# Patient Record
Sex: Male | Born: 1990
Health system: Southern US, Community
[De-identification: ages and names within clinical notes are randomized; demographics above are authoritative.]

## PROBLEM LIST (undated history)

## (undated) DIAGNOSIS — Q676 Pectus excavatum: Secondary | ICD-10-CM

## (undated) DIAGNOSIS — L709 Acne, unspecified: Secondary | ICD-10-CM

## (undated) HISTORY — DX: Acne, unspecified: L70.9

## (undated) HISTORY — DX: Pectus excavatum: Q67.6

---
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
## 2013-04-25 ENCOUNTER — Ambulatory Visit
Admission: RE | Admit: 2013-04-25 | Discharge: 2013-04-25 | Disposition: A | Discharge status: Home / Self Care | Payer: No Typology Code available for payment source | Source: Ambulatory Visit | Attending: Occupational Medicine | Admitting: Occupational Medicine

## 2013-04-25 ENCOUNTER — Other Ambulatory Visit: Payer: Self-pay | Admitting: Occupational Medicine

## 2013-04-25 DIAGNOSIS — Z021 Encounter for pre-employment examination: Secondary | ICD-10-CM

## 2015-02-22 ENCOUNTER — Ambulatory Visit (INDEPENDENT_AMBULATORY_CARE_PROVIDER_SITE_OTHER): Payer: 59 | Admitting: Family Medicine

## 2015-02-22 VITALS — BP 108/67 | HR 53 | Temp 97.9°F | Resp 18 | Ht 75.0 in | Wt 194.5 lb

## 2015-02-22 DIAGNOSIS — T675XXA Heat exhaustion, unspecified, initial encounter: Secondary | ICD-10-CM | POA: Diagnosis not present

## 2015-02-22 DIAGNOSIS — R112 Nausea with vomiting, unspecified: Secondary | ICD-10-CM

## 2015-02-22 DIAGNOSIS — R197 Diarrhea, unspecified: Secondary | ICD-10-CM | POA: Diagnosis not present

## 2015-02-22 LAB — POCT CBC
GRANULOCYTE PERCENT: 54.8 % (ref 37–80)
HCT, POC: 49.9 % (ref 43.5–53.7)
Hemoglobin: 15.5 g/dL (ref 14.1–18.1)
Lymph, poc: 1.2 (ref 0.6–3.4)
MCH: 27.4 pg (ref 27–31.2)
MCHC: 31 g/dL — AB (ref 31.8–35.4)
MCV: 88.3 fL (ref 80–97)
MID (cbc): 0.6 (ref 0–0.9)
MPV: 7.3 fL (ref 0–99.8)
PLATELET COUNT, POC: 201 10*3/uL (ref 142–424)
POC Granulocyte: 2.1 (ref 2–6.9)
POC LYMPH PERCENT: 30.4 %L (ref 10–50)
POC MID %: 14.8 %M — AB (ref 0–12)
RBC: 5.65 M/uL (ref 4.69–6.13)
RDW, POC: 13 %
WBC: 3.8 10*3/uL — AB (ref 4.6–10.2)

## 2015-02-22 LAB — POCT URINALYSIS DIPSTICK
Bilirubin, UA: NEGATIVE
Blood, UA: NEGATIVE
Glucose, UA: NEGATIVE
Ketones, UA: NEGATIVE
Leukocytes, UA: NEGATIVE
Nitrite, UA: NEGATIVE
PH UA: 6
PROTEIN UA: NEGATIVE
Urobilinogen, UA: 0.2

## 2015-02-22 LAB — GLUCOSE, POCT (MANUAL RESULT ENTRY): POC GLUCOSE: 93 mg/dL (ref 70–99)

## 2015-02-22 MED ORDER — HYOSCYAMINE SULFATE 0.125 MG PO TABS
0.1250 mg | ORAL_TABLET | Freq: Four times a day (QID) | ORAL | Status: DC | PRN
Start: 2015-02-22 — End: 2016-01-20

## 2015-02-22 NOTE — Progress Notes (Signed)
Subjective:    Patient ID: Steven Shields, male    DOB: 07-06-1990, 24 y.o.   MRN: [id number]  02/22/2015  Abdominal Cramping; Nausea; Headache; Dehydration; and Anorexia   HPI This 24 y.o. male presents for evaluation of dehydration.  Onset five days ago; decreased appetite, nausea, headache for the past five days. Headache is slightly improved today but still there.  No fever/chills/sweats.  Excessive fatigue.  Layed around all day yesterday.  No sore throat , ear pain, rhinorrhea, cough.  +vomited dry heaved this morning; did vomit today at 2:00pm.  +diarrhea this morning. Non bloody.  No recent travel.  No sick contacts.  +abdominal pain cramping all week.  No rash.  No tick bites.  Works at [employer]; normal night; had a service test around 4:00pm; outside for two hours with excessive sweating.  Not sure if got too hot.  Nothing new for patient.  Drinking water and powerade during the day. Not eating much at all; usually eats all day but now not hungry at all.  Yesterday, did not eat anything until 4:00pm; ate bacon egg and cheese sandwich and cereal yesterday.  Urinating three times today; yesterday urinated twice.  Tried to increase water yesterday.  Urine is not discolored.  Urine is bright yellow.  No chest pain, palpitations, SOB.  Sluggish.  Has not worked much this week; was off two days ago.  Scheduled to work tomorrow but off tomorrow for a class.     Review of Systems  Constitutional: Positive for activity change, appetite change and fatigue. Negative for fever, chills and diaphoresis.  HENT: Negative for congestion, ear pain, postnasal drip, rhinorrhea, sinus pressure and sore throat.   Eyes: Negative for photophobia and visual disturbance.  Respiratory: Negative for cough and shortness of breath.   Cardiovascular: Negative for chest pain, palpitations and leg swelling.  Gastrointestinal: Positive for nausea, vomiting, abdominal pain and diarrhea. Negative for  constipation, blood in stool, abdominal distention, anal bleeding and rectal pain.  Endocrine: Negative for cold intolerance, heat intolerance, polydipsia, polyphagia and polyuria.  Genitourinary: Negative for decreased urine volume and difficulty urinating.  Skin: Negative for rash.  Neurological: Positive for headaches. Negative for dizziness, tremors, seizures, syncope, facial asymmetry, speech difficulty, weakness, light-headedness and numbness.  Psychiatric/Behavioral: Negative for confusion.    Past Medical History  Diagnosis Date  . Acne   . Pectus excavatum    History reviewed. No pertinent past surgical history. No Known Allergies No current outpatient prescriptions on file.   No current facility-administered medications for this visit.   Social History   Social History  . Marital Status: Single    Spouse Name: N/A  . Number of Children: N/A  . Years of Education: N/A   Occupational History  . firefighter    Social History Main Topics  . Smoking status: Never Smoker   . Smokeless tobacco: Never Used  . Alcohol Use: 0.0 oz/week    0 Standard drinks or equivalent per week     Comment: occasionally  . Drug Use: No  . Sexual Activity: Not on file   Other Topics Concern  . Not on file   Social History Narrative   Family History  Problem Relation Age of Onset  . Diabetes Mother     type 1  . Diabetes Maternal Grandfather        Objective:    BP 108/67 mmHg  Pulse 53  Temp(Src) 97.9 F (36.6 C) (Oral)  Resp 18  Ht  6\' 3"  (1.905 m)  Wt 194 lb 8 oz (88.225 kg)  BMI 24.31 kg/m2  SpO2 99% Physical Exam  Constitutional: He is oriented to person, place, and time. He appears well-developed and well-nourished. No distress.  HENT:  Head: Normocephalic and atraumatic.  Right Ear: External ear normal.  Left Ear: External ear normal.  Nose: Nose normal.  Mouth/Throat: Oropharynx is clear and moist.  Eyes: Conjunctivae and EOM are normal. Pupils are equal,  round, and reactive to light.  Neck: Normal range of motion. Neck supple. Carotid bruit is not present. No thyromegaly present.  Cardiovascular: Normal rate, regular rhythm, normal heart sounds and intact distal pulses.  Exam reveals no gallop and no friction rub.   No murmur heard. Pulmonary/Chest: Effort normal and breath sounds normal. He has no wheezes. He has no rales.  Abdominal: Soft. Bowel sounds are normal. He exhibits no distension and no mass. There is no tenderness. There is no rebound and no guarding.  Lymphadenopathy:    He has no cervical adenopathy.  Neurological: He is alert and oriented to person, place, and time. No cranial nerve deficit.  Skin: Skin is warm and dry. No rash noted. He is not diaphoretic.  Psychiatric: He has a normal mood and affect. His behavior is normal.  Nursing note and vitals reviewed.  Results for orders placed or performed in visit on 02/22/15  POCT CBC  Result Value Ref Range   WBC 3.8 (A) 4.6 - 10.2 K/uL   Lymph, poc 1.2 0.6 - 3.4   POC LYMPH PERCENT 30.4 10 - 50 %L   MID (cbc) 0.6 0 - 0.9   POC MID % 14.8 (A) 0 - 12 %M   POC Granulocyte 2.1 2 - 6.9   Granulocyte percent 54.8 37 - 80 %G   RBC 5.65 4.69 - 6.13 M/uL   Hemoglobin 15.5 14.1 - 18.1 g/dL   HCT, POC 16.1 09.6 - 53.7 %   MCV 88.3 80 - 97 fL   MCH, POC 27.4 27 - 31.2 pg   MCHC 31.0 (A) 31.8 - 35.4 g/dL   RDW, POC 04.5 %   Platelet Count, POC 201 142 - 424 K/uL   MPV 7.3 0 - 99.8 fL  POCT glucose (manual entry)  Result Value Ref Range   POC Glucose 93 70 - 99 mg/dl  POCT urinalysis dipstick  Result Value Ref Range   Color, UA yellow    Clarity, UA clear    Glucose, UA negative    Bilirubin, UA negative    Ketones, UA negative    Spec Grav, UA <=1.005    Blood, UA negative    pH, UA 6.0    Protein, UA negative    Urobilinogen, UA 0.2    Nitrite, UA negative    Leukocytes, UA Negative Negative   EKG: sinus bradycardia at 46; no ST changes.  No data found.         Assessment & Plan:   1. Heat exhaustion, initial encounter   2. Non-intractable vomiting with nausea, vomiting of unspecified type   3. Diarrhea    -New. -Consistent with heat related symptoms followed by possible viral gastroenteritis.  -Orthostatics are normal in office; urine clear and not suggestive of severe dehydration; taking in po well.  Pt declined anti-emetic.   -Obtain labs.   -Supportive care with fluids, BRAT diet, rest.   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 -RTC for acute worsening or if not improved in upcoming week.  No orders of the defined types were placed in this encounter.    No Follow-up on file.   Yesly Gerety Paulita Fujita, M.D. [facility] [address] [place], [place]  [address] [phone] phone [phone] fax

## 2015-02-22 NOTE — Patient Instructions (Signed)

## 2015-02-23 LAB — COMPREHENSIVE METABOLIC PANEL
ALK PHOS: 88 U/L (ref 40–115)
ALT: 29 U/L (ref 9–46)
AST: 30 U/L (ref 10–40)
Albumin: 4.1 g/dL (ref 3.6–5.1)
BUN: 12 mg/dL (ref 7–25)
CO2: 27 mmol/L (ref 20–31)
Calcium: 9.4 mg/dL (ref 8.6–10.3)
Chloride: 101 mmol/L (ref 98–110)
Creat: 1.13 mg/dL (ref 0.60–1.35)
GLUCOSE: 91 mg/dL (ref 65–99)
POTASSIUM: 4.1 mmol/L (ref 3.5–5.3)
Sodium: 137 mmol/L (ref 135–146)
Total Bilirubin: 2.2 mg/dL — ABNORMAL HIGH (ref 0.2–1.2)
Total Protein: 6.6 g/dL (ref 6.1–8.1)

## 2015-02-23 LAB — CK: Total CK: 186 U/L (ref 7–232)

## 2015-02-27 ENCOUNTER — Emergency Department (HOSPITAL_BASED_OUTPATIENT_CLINIC_OR_DEPARTMENT_OTHER): Payer: Commercial Managed Care - HMO

## 2015-02-27 ENCOUNTER — Emergency Department (HOSPITAL_BASED_OUTPATIENT_CLINIC_OR_DEPARTMENT_OTHER)
Admission: EM | Admit: 2015-02-27 | Discharge: 2015-02-27 | Disposition: A | Discharge status: Home / Self Care | Payer: Commercial Managed Care - HMO | Attending: Emergency Medicine | Admitting: Emergency Medicine

## 2015-02-27 ENCOUNTER — Encounter (HOSPITAL_BASED_OUTPATIENT_CLINIC_OR_DEPARTMENT_OTHER): Payer: Self-pay | Admitting: *Deleted

## 2015-02-27 DIAGNOSIS — Q676 Pectus excavatum: Secondary | ICD-10-CM | POA: Insufficient documentation

## 2015-02-27 DIAGNOSIS — B2790 Infectious mononucleosis, unspecified without complication: Secondary | ICD-10-CM | POA: Diagnosis not present

## 2015-02-27 DIAGNOSIS — K811 Chronic cholecystitis: Secondary | ICD-10-CM | POA: Diagnosis not present

## 2015-02-27 DIAGNOSIS — Z872 Personal history of diseases of the skin and subcutaneous tissue: Secondary | ICD-10-CM | POA: Insufficient documentation

## 2015-02-27 DIAGNOSIS — R109 Unspecified abdominal pain: Secondary | ICD-10-CM

## 2015-02-27 LAB — LIPASE, BLOOD: Lipase: 26 U/L (ref 22–51)

## 2015-02-27 LAB — COMPREHENSIVE METABOLIC PANEL
ALBUMIN: 3.8 g/dL (ref 3.5–5.0)
ALK PHOS: 77 U/L (ref 38–126)
ALT: 175 U/L — ABNORMAL HIGH (ref 17–63)
AST: 120 U/L — ABNORMAL HIGH (ref 15–41)
Anion gap: 9 (ref 5–15)
BUN: 15 mg/dL (ref 6–20)
CALCIUM: 9 mg/dL (ref 8.9–10.3)
CO2: 28 mmol/L (ref 22–32)
Chloride: 102 mmol/L (ref 101–111)
Creatinine, Ser: 1.2 mg/dL (ref 0.61–1.24)
GFR calc Af Amer: >60 mL/min (ref >60)
GFR calc non Af Amer: >60 mL/min (ref >60)
GLUCOSE: 101 mg/dL — AB (ref 65–99)
POTASSIUM: 4.3 mmol/L (ref 3.5–5.1)
SODIUM: 139 mmol/L (ref 135–145)
Total Bilirubin: 1.4 mg/dL — ABNORMAL HIGH (ref 0.3–1.2)
Total Protein: 6.7 g/dL (ref 6.5–8.1)

## 2015-02-27 LAB — CBC WITH DIFFERENTIAL/PLATELET
BASOS PCT: 1 % (ref 0–1)
Basophils Absolute: 0 10*3/uL (ref 0.0–0.1)
EOS PCT: 3 % (ref 0–5)
Eosinophils Absolute: 0.2 10*3/uL (ref 0.0–0.7)
HCT: 47.9 % (ref 39.0–52.0)
HEMOGLOBIN: 16.5 g/dL (ref 13.0–17.0)
Lymphocytes Relative: 17 % (ref 12–46)
Lymphs Abs: 0.8 10*3/uL (ref 0.7–4.0)
MCH: 29.7 pg (ref 26.0–34.0)
MCHC: 34.4 g/dL (ref 30.0–36.0)
MCV: 86.3 fL (ref 78.0–100.0)
Monocytes Absolute: 1.1 10*3/uL — ABNORMAL HIGH (ref 0.1–1.0)
Monocytes Relative: 23 % — ABNORMAL HIGH (ref 3–12)
NEUTROS PCT: 56 % (ref 43–77)
Neutro Abs: 2.8 10*3/uL (ref 1.7–7.7)
Platelets: 218 10*3/uL (ref 150–400)
RBC: 5.55 MIL/uL (ref 4.22–5.81)
RDW: 12.4 % (ref 11.5–15.5)
WBC: 5 10*3/uL (ref 4.0–10.5)

## 2015-02-27 LAB — URINALYSIS, ROUTINE W REFLEX MICROSCOPIC
Bilirubin Urine: NEGATIVE
Glucose, UA: NEGATIVE mg/dL
Hgb urine dipstick: NEGATIVE
Ketones, ur: 15 mg/dL — AB
LEUKOCYTES UA: NEGATIVE
NITRITE: NEGATIVE
PROTEIN: NEGATIVE mg/dL
SPECIFIC GRAVITY, URINE: 1.026 (ref 1.005–1.030)
UROBILINOGEN UA: 0.2 mg/dL (ref 0.0–1.0)
pH: 5.5 (ref 5.0–8.0)

## 2015-02-27 LAB — MONONUCLEOSIS SCREEN: MONO SCREEN: POSITIVE — AB

## 2015-02-27 MED ORDER — METOCLOPRAMIDE HCL 5 MG/ML IJ SOLN
10.0000 mg | Freq: Once | INTRAMUSCULAR | Status: AC
  Administered 2015-02-27: 10 mg via INTRAVENOUS
  Filled 2015-02-27: qty 2

## 2015-02-27 MED ORDER — SODIUM CHLORIDE 0.9 % IV BOLUS (SEPSIS)
1000.0000 mL | Freq: Once | INTRAVENOUS | Status: AC
  Administered 2015-02-27: 1000 mL via INTRAVENOUS

## 2015-02-27 MED ORDER — DIPHENHYDRAMINE HCL 50 MG/ML IJ SOLN
25.0000 mg | Freq: Once | INTRAMUSCULAR | Status: AC
  Administered 2015-02-27: 25 mg via INTRAVENOUS
  Filled 2015-02-27: qty 1

## 2015-02-27 MED ORDER — ONDANSETRON HCL 4 MG/2ML IJ SOLN
4.0000 mg | Freq: Once | INTRAMUSCULAR | Status: AC
  Administered 2015-02-27: 4 mg via INTRAVENOUS
  Filled 2015-02-27: qty 2

## 2015-02-27 MED ORDER — ONDANSETRON HCL 4 MG PO TABS: 4.0000 mg | ORAL_TABLET | Freq: Four times a day (QID) | ORAL | Status: DC

## 2015-02-27 NOTE — Discharge Instructions (Signed)
Infectious Mononucleosis Infectious mononucleosis (mono) is a common germ (viral) infection in children, teenagers, and young adults.  CAUSES  Mono is an infection caused by the Malachi Carl virus. The virus is spread by close personal contact with someone who has the infection. It can be passed by contact with your saliva through things such as kissing or sharing drinking glasses. Sometimes, the infection can be spread from someone who does not appear sick but still spreads the virus (asymptomatic carrier state).  SYMPTOMS  The most common symptoms of Mono are:  Sore throat.  Headache.  Fatigue.  Muscle aches.  Swollen glands.  Fever.  Poor appetite.  Enlarged liver or spleen. The less common symptoms can include:  Rash.  Feeling sick to your stomach (nauseous).  Abdominal pain. DIAGNOSIS  Mono is diagnosed by a blood test.  TREATMENT  Treatment of mono is usually at home. There is no medicine that cures this virus. Sometimes hospital treatment is needed in severe cases. Steroid medicine sometimes is needed if the swelling in the throat causes breathing or swallowing problems.  HOME CARE INSTRUCTIONS   Drink enough fluids to keep your urine clear or pale yellow.  Eat soft foods. Cool foods like popsicles or ice cream can soothe a sore throat.  Only take over-the-counter or prescription medicines for pain, discomfort, or fever as directed by your caregiver. Children under 77 years of age should not take aspirin.  Gargle salt water. This may help relieve your sore throat. Put 1 teaspoon (tsp) of salt in 1 cup of warm water. Sucking on hard candy may also help.  Rest as needed.  Start regular activities gradually after the fever is gone. Be sure to rest when tired.  Avoid strenuous exercise or contact sports until your caregiver says it is okay. The liver and spleen could be seriously injured.  Avoid sharing drinking glasses or kissing until your caregiver tells you  that you are no longer contagious. SEEK MEDICAL CARE IF:   Your fever is not gone after 7 days.  Your activity level is not back to normal after 2 weeks.  You have yellow coloring to eyes and skin (jaundice). SEEK IMMEDIATE MEDICAL CARE IF:   You have severe pain in the abdomen or shoulder.  You have trouble swallowing or drooling.  You have trouble breathing.  You develop a stiff neck.  You develop a severe headache.  You cannot stop throwing up (vomiting).  You have convulsions.  You are confused.  You have trouble with balance.  You develop signs of body fluid loss (dehydration):  Weakness.  Sunken eyes.  Pale skin.  Dry mouth.  Rapid breathing or pulse. MAKE SURE YOU:   Understand these instructions.  Will watch your condition.  Will get help right away if you are not doing well or get worse. Document Released: 06/18/2000 Document Revised: 09/13/2011 Document Reviewed: 04/16/2008 [facility] Patient Information 2015 [place], [place]. This information is not intended to replace advice given to you by your health care provider. Make sure you discuss any questions you have with your health care provider.   Your Gallbladder Ultrasound showed inflammation of your gallbladder, that can happen with chronic cholecystitis - please follow up with the surgeon for further evaluation.  If you have worsening pain or new concerning symptoms get rechecked immediately.

## 2015-02-27 NOTE — ED Provider Notes (Signed)
CSN: [id number]     Arrival date & time 02/27/15  1533 History   First MD Initiated Contact with Patient 02/27/15 1557     Chief Complaint  Patient presents with  . Abdominal Pain     Patient is a 24 y.o. male presenting with abdominal pain. The history is provided by the patient. No language interpreter was used.  Abdominal Pain  Steven Shields presents for evaluation of nausea, vomiting, diarrhea, headache. Symptoms started a week and a half ago with nausea, intermittent epigastric cramping, dull posterior headache. He reports emesis, one to 2 episodes daily. He has decreased appetite. He denies any fevers, sick contacts, bad food exposure, untreated water exposure, similar previous symptoms. He feels fatigued with generalized weakness. He works as a IT sales professional. He was seen a week ago at urgent care for similar sxs and presents today for worsening sxs.    Past Medical History  Diagnosis Date  . Acne   . Pectus excavatum    History reviewed. No pertinent past surgical history. Family History  Problem Relation Age of Onset  . Diabetes Mother     type 1  . Diabetes Maternal Grandfather    Social History  Substance Use Topics  . Smoking status: Never Smoker   . Smokeless tobacco: Never Used  . Alcohol Use: 0.0 oz/week    0 Standard drinks or equivalent per week     Comment: occasionally    Review of Systems  Gastrointestinal: Positive for abdominal pain.  All other systems reviewed and are negative.     Allergies  Review of patient's allergies indicates no known allergies.  Home Medications   Prior to Admission medications   Medication Sig Start Date End Date Taking? Authorizing Provider  hyoscyamine (LEVSIN, ANASPAZ) 0.125 MG tablet Take 1 tablet (0.125 mg total) by mouth every 6 (six) hours as needed for cramping. 02/22/15   Ethelda Chick, MD  ondansetron (ZOFRAN) 4 MG tablet Take 1 tablet (4 mg total) by mouth every 6 (six) hours. 02/27/15   Tilden Fossa, MD   BP  108/55 mmHg  Pulse 56  Temp(Src) 98.5 F (36.9 C) (Oral)  Resp 18  Ht 6\' 2"  (1.88 m)  Wt 188 lb (85.276 kg)  BMI 24.13 kg/m2  SpO2 99% Physical Exam  Constitutional: He is oriented to person, place, and time. He appears well-developed and well-nourished.  HENT:  Head: Normocephalic and atraumatic.  Cardiovascular: Normal rate and regular rhythm.   No murmur heard. Pulmonary/Chest: Effort normal and breath sounds normal. No respiratory distress.  Abdominal: Soft. There is no tenderness. There is no rebound and no guarding.  Musculoskeletal: He exhibits no edema or tenderness.  Neurological: He is alert and oriented to person, place, and time.  Skin: Skin is warm and dry.  Psychiatric: He has a normal mood and affect. His behavior is normal.  Nursing note and vitals reviewed.   ED Course  Procedures (including critical care time) Labs Review Labs Reviewed  CBC WITH DIFFERENTIAL/PLATELET - Abnormal; Notable for the following:    Monocytes Relative 23 (*)    Monocytes Absolute 1.1 (*)    All other components within normal limits  COMPREHENSIVE METABOLIC PANEL - Abnormal; Notable for the following:    Glucose, Bld 101 (*)    AST 120 (*)    ALT 175 (*)    Total Bilirubin 1.4 (*)    All other components within normal limits  URINALYSIS, ROUTINE W REFLEX MICROSCOPIC (NOT AT [facility]) - Abnormal; Notable  for the following:    Ketones, ur 15 (*)    All other components within normal limits  MONONUCLEOSIS SCREEN - Abnormal; Notable for the following:    Mono Screen POSITIVE (*)    All other components within normal limits  LIPASE, BLOOD    Imaging Review US Abdomen Complete  02/27/2015   CLINICAL DATA:  Nausea, vomiting, diarrhea and fatigue for the past week.  EXAM: ULTRASOUND ABDOMEN COMPLETE  COMPARISON:  None.  FINDINGS: Gallbladder: Sludge. Mild diffuse wall thickening measuring up to 3.5 mm in maximum thickness. No gallstones, pericholecystic fluid or focal tenderness over the  gallbladder.  Common bile duct: Diameter: 2.1 mm  Liver: No focal lesion identified. Within normal limits in parenchymal echogenicity.  IVC: No abnormality visualized.  Pancreas: Visualized portion unremarkable.  Spleen: Size and appearance within normal limits.  Right Kidney: Length: 12.3 cm. Echogenicity within normal limits. No mass or hydronephrosis visualized.  Left Kidney: Length: 12.0 cm. Echogenicity within normal limits. No mass or hydronephrosis visualized.  Abdominal aorta: No aneurysm visualized.  Other findings: None.  IMPRESSION: 1. Sludge in the gallbladder. 2. Mild diffuse gallbladder wall thickening, most likely due to chronic cholecystitis. 3. Otherwise, unremarkable examination.   Electronically Signed   By: Beckie Salts M.D.   On: 02/27/2015 18:13   I have personally reviewed and evaluated these images and lab results as part of my medical decision-making.   EKG Interpretation None      MDM   Final diagnoses:  Abdominal pain  Mononucleosis  Chronic cholecystitis without calculus    Patient here for evaluation of intermittent abdominal pain, nausea, vomiting, diarrhea, headache. Patient is positive for mono. Patient has no abdominal tenderness on examination and feels improved on recheck, vital quadrant ultrasound does have some evidence of chronic cholecystitis. Plan to DC home with close outpatient surgery follow-up as well as PCP follow-up. Provided patient with close return precautions for mono as well as chronic cholecystitis.    Tilden Fossa, MD 02/27/15 [phone]

## 2015-02-27 NOTE — ED Notes (Signed)
MD at bedside. 

## 2015-02-27 NOTE — ED Notes (Signed)
Abdominal pain, vomiting and diarrhea x 5 days. He was seen at [facility] for same and is no better.

## 2015-10-16 MED FILL — predniSONE 10 MG (48) TBPK: 10 | 12 days supply | Qty: 48 | Fill #0

## 2016-01-17 ENCOUNTER — Ambulatory Visit (HOSPITAL_COMMUNITY)
Admission: RE | Admit: 2016-01-17 | Discharge: 2016-01-17 | Disposition: A | Discharge status: Home / Self Care | Payer: Commercial Managed Care - HMO | Source: Ambulatory Visit | Attending: Family Medicine | Admitting: Family Medicine

## 2016-01-17 ENCOUNTER — Ambulatory Visit (INDEPENDENT_AMBULATORY_CARE_PROVIDER_SITE_OTHER): Payer: Commercial Managed Care - HMO | Admitting: Family Medicine

## 2016-01-17 ENCOUNTER — Ambulatory Visit (HOSPITAL_COMMUNITY)
Admission: AD | Admit: 2016-01-17 | Discharge: 2016-01-17 | Disposition: A | Discharge status: Home / Self Care | Payer: Commercial Managed Care - HMO | Source: Ambulatory Visit | Attending: Family Medicine | Admitting: Family Medicine

## 2016-01-17 VITALS — BP 138/80 | HR 69 | Temp 97.9°F | Resp 12 | Ht 73.0 in | Wt 204.0 lb

## 2016-01-17 DIAGNOSIS — R161 Splenomegaly, not elsewhere classified: Secondary | ICD-10-CM | POA: Insufficient documentation

## 2016-01-17 DIAGNOSIS — R1011 Right upper quadrant pain: Secondary | ICD-10-CM | POA: Diagnosis not present

## 2016-01-17 LAB — COMPREHENSIVE METABOLIC PANEL
ALT: 37 U/L (ref 9–46)
AST: 46 U/L — ABNORMAL HIGH (ref 10–40)
Albumin: 4.4 g/dL (ref 3.6–5.1)
Alkaline Phosphatase: 68 U/L (ref 40–115)
BUN: 25 mg/dL (ref 7–25)
CHLORIDE: 105 mmol/L (ref 98–110)
CO2: 28 mmol/L (ref 20–31)
Calcium: 9.3 mg/dL (ref 8.6–10.3)
Creat: 0.99 mg/dL (ref 0.60–1.35)
Glucose, Bld: 95 mg/dL (ref 65–99)
POTASSIUM: 4.2 mmol/L (ref 3.5–5.3)
Sodium: 138 mmol/L (ref 135–146)
TOTAL PROTEIN: 6.9 g/dL (ref 6.1–8.1)
Total Bilirubin: 1 mg/dL (ref 0.2–1.2)

## 2016-01-17 LAB — POCT CBC
Granulocyte percent: 56 %G (ref 37–80)
HCT, POC: 42.3 % — AB (ref 43.5–53.7)
Hemoglobin: 14.7 g/dL (ref 14.1–18.1)
Lymph, poc: 1.2 (ref 0.6–3.4)
MCH: 29.7 pg (ref 27–31.2)
MCHC: 34.7 g/dL (ref 31.8–35.4)
MCV: 85.5 fL (ref 80–97)
MID (CBC): 0.4 (ref 0–0.9)
MPV: 7.3 fL (ref 0–99.8)
PLATELET COUNT, POC: 166 10*3/uL (ref 142–424)
POC GRANULOCYTE: 2.1 (ref 2–6.9)
POC LYMPH PERCENT: 31.9 %L (ref 10–50)
POC MID %: 12.1 % — AB (ref 0–12)
RBC: 4.94 M/uL (ref 4.69–6.13)
RDW, POC: 12.4 %
WBC: 3.7 10*3/uL — AB (ref 4.6–10.2)

## 2016-01-17 LAB — LIPASE: LIPASE: 94 U/L — AB (ref 7–60)

## 2016-01-17 MED ORDER — TRAMADOL HCL 50 MG PO TABS: 50.0000 mg | ORAL_TABLET | Freq: Three times a day (TID) | ORAL | Status: DC | PRN

## 2016-01-17 NOTE — Progress Notes (Addendum)
Steven Shields is a 25 y.o. male who presents to [facility] today for right upper quadrant abdominal pain. Patient has a 4 day history of right upper quadrant abdominal pain initially presenting with fevers and chills. Systemically he is feeling much better but notes continued severe at times right upper quadrant abdominal pain. He notes the pain seems to be worse with deep breathing bending forward and after meals. He denies any vomiting or diarrhea. He notes that in August 2016 he had an abdominal ultrasound that showed chronic gallbladder thickening. He never really followed up this issue. 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 He has tried ibuprofen which helps a little.   Past Medical History  Diagnosis Date  . Acne   . Pectus excavatum    No past surgical history on file. Social History  Substance Use Topics  . Smoking status: Never Smoker   . Smokeless tobacco: Never Used  . Alcohol Use: 0.0 oz/week    0 Standard drinks or equivalent per week     Comment: occasionally   ROS as above Medications: Current Outpatient Prescriptions  Medication Sig Dispense Refill  . ondansetron (ZOFRAN) 4 MG tablet Take 1 tablet (4 mg total) by mouth every 6 (six) hours. 12 tablet 0  . hyoscyamine (LEVSIN, ANASPAZ) 0.125 MG tablet Take 1 tablet (0.125 mg total) by mouth every 6 (six) hours as needed for cramping. (Patient not taking: Reported on 01/17/2016) 30 tablet 0   No current facility-administered medications for this visit.   No Known Allergies   Exam:  BP 138/80 mmHg  Pulse 69  Temp(Src) 97.9 F (36.6 C) (Oral)  Resp 12  Ht  (1.854 m)  Wt 204 lb (92.534 kg)  BMI 26.92 kg/m2  SpO2 99% Gen: Well NAD HEENT: EOMI,  MMM Lungs: Normal work of breathing. CTABL Heart: RRR no MRG Abd: NABS, Soft. Nondistended, Very tender to palpation right upper quadrant with positive Murphy sign. No rebound tenderness. Exts: Brisk capillary refill, warm and well  perfused.   Results for orders placed or performed in visit on 01/17/16 (from the past 24 hour(s))  POCT CBC     Status: Abnormal   Collection Time: 01/17/16 12:03 PM  Result Value Ref Range   WBC 3.7 (A) 4.6 - 10.2 K/uL   Lymph, poc 1.2 0.6 - 3.4   POC LYMPH PERCENT 31.9 10 - 50 %L   MID (cbc) 0.4 0 - 0.9   POC MID % 12.1 (A) 0 - 12 %M   POC Granulocyte 2.1 2 - 6.9   Granulocyte percent 56.0 37 - 80 %G   RBC 4.94 4.69 - 6.13 M/uL   Hemoglobin 14.7 14.1 - 18.1 g/dL   HCT, POC 16.1 (A) 09.6 - 53.7 %   MCV 85.5 80 - 97 fL   MCH, POC 29.7 27 - 31.2 pg   MCHC 34.7 31.8 - 35.4 g/dL   RDW, POC 04.5 %   Platelet Count, POC 166 142 - 424 K/uL   MPV 7.3 0 - 99.8 fL   No results found.  Assessment and Plan: 25 y.o. male with  Significant right upper quadrant abdominal pain. Based on the exam and history of gallbladder thickening on previous ultrasound I'm suspicious for acute cholecystitis Plan for stat abdominal ultrasound today, stat CBC, CMP, and lipase. Patient's last meal was 7 AM. Patient's phone number where he may be reached today is [phone]. I will call patient with results.  Discussed warning signs or symptoms. Please see discharge instructions. Patient expresses understanding.   Addendum: Results below. Suspect diagnosis is mild acute pancreatitis. Plan for clear liquid diet and tramadol for pain control. Follow-up with my clinic on Monday if not better. Go to the emergency room if worse.  [place] Abdomen Complete  01/17/2016  CLINICAL DATA:  Right upper quadrant pain.  Pain for 2 days. EXAM: ABDOMEN ULTRASOUND COMPLETE COMPARISON:  None. FINDINGS: Gallbladder: No gallstones or wall thickening visualized. No sonographic Murphy sign noted by sonographer. Common bile duct: Diameter: 3 mm Liver: No focal lesion identified. Within normal limits in parenchymal echogenicity. IVC: No abnormality visualized. Pancreas: Visualized portion unremarkable. Spleen: Mildly enlarged spleen  measuring 433 mL without focal abnormality. Right Kidney: Length: 12.2 cm. Echogenicity within normal limits. No mass or hydronephrosis visualized. Left Kidney: Length: 12.2 cm. Echogenicity within normal limits. No mass or hydronephrosis visualized. Abdominal aorta: No aneurysm visualized. Other findings: None. IMPRESSION: 1. No cholelithiasis or sonographic evidence of acute cholecystitis. 2. Mild splenomegaly. Electronically Signed   By: Elige KoHetal  Patel   On: 01/17/2016 15:11    Results for orders placed or performed in visit on 01/17/16 (from the past 24 hour(s))  Comprehensive metabolic panel     Status: Abnormal   Collection Time: 01/17/16 11:56 AM  Result Value Ref Range   Sodium 138 135 - 146 mmol/L   Potassium 4.2 3.5 - 5.3 mmol/L   Chloride 105 98 - 110 mmol/L   CO2 28 20 - 31 mmol/L   Glucose, Bld 95 65 - 99 mg/dL   BUN 25 7 - 25 mg/dL   Creat 8.460.99 9.620.60 - 9.521.35 mg/dL   Total Bilirubin 1.0 0.2 - 1.2 mg/dL   Alkaline Phosphatase 68 40 - 115 U/L   AST 46 (H) 10 - 40 U/L   ALT 37 9 - 46 U/L   Total Protein 6.9 6.1 - 8.1 g/dL   Albumin 4.4 3.6 - 5.1 g/dL   Calcium 9.3 8.6 - 84.110.3 mg/dL   Narrative   Performed at:  [facility]                [address], [address]                [place], [place] [address]  Lipase     Status: Abnormal   Collection Time: 01/17/16 11:56 AM  Result Value Ref Range   Lipase 94 (H) 7 - 60 U/L   Narrative   Performed at:  [facility]                [address], [address]                [place], [place] [address]  POCT CBC     Status: Abnormal   Collection Time: 01/17/16 12:03 PM  Result Value Ref Range   WBC 3.7 (A) 4.6 - 10.2 K/uL   Lymph, poc 1.2 0.6 - 3.4   POC LYMPH PERCENT 31.9 10 - 50 %L   MID (cbc) 0.4 0 - 0.9   POC MID % 12.1 (A) 0 - 12 %M   POC Granulocyte 2.1 2 - 6.9   Granulocyte percent 56.0 37 - 80 %G   RBC 4.94 4.69 - 6.13 M/uL   Hemoglobin 14.7 14.1 - 18.1 g/dL   HCT, POC 03.442.3 (A) 74.243.5 - 53.7 %   MCV 85.5 80 -  97 fL   MCH, POC 29.7 27 - 31.2 pg  MCHC 34.7 31.8 - 35.4 g/dL   RDW, POC 16.1 %   Platelet Count, POC 166 142 - 424 K/uL   MPV 7.3 0 - 99.8 fL

## 2016-01-17 NOTE — Addendum Note (Signed)
Addended by: Rodolph BongOREY, EVAN S on: 01/17/2016 03:52 PM   Modules accepted: Orders

## 2016-01-17 NOTE — Patient Instructions (Addendum)
Thank you for coming in today. Go to the ER and check in at 2pm and tell them you are here for an outpatient ultrasound.  Keep your phone handy.  I will call with results today. If worse go to the ER.  If your belly pain worsens, or you have high fever, bad vomiting, blood in your stool or black tarry stool go to the Emergency Room.   Cholecystitis Cholecystitis is inflammation of the gallbladder. It is often called a gallbladder attack. The gallbladder is a pear-shaped organ that lies beneath the liver on the right side of the body. The gallbladder stores bile, which is a fluid that helps the body to digest fats. If bile builds up in your gallbladder, your gallbladder becomes inflamed. This condition may occur suddenly (be acute). Repeat episodes of acute cholecystitis or prolonged episodes may lead to a long-term (chronic) condition. Cholecystitis is serious and it requires treatment.  CAUSES The most common cause of this condition is gallstones. Gallstones can block the tube (duct) that carries bile out of your gallbladder. This causes bile to build up. Other causes of this condition include:  Damage to the gallbladder due to a decrease in blood flow.  Infections in the bile ducts.  Scars or kinks in the bile ducts.  Tumors in the liver, pancreas, or gallbladder. RISK FACTORS This condition is more likely to develop in:  People who have sickle cell disease.  People who take birth control pills or use estrogen.  People who have alcoholic liver disease.  People who have liver cirrhosis.  People who have their nutrition delivered through a vein (parenteral nutrition).  People who do not eat or drink (do fasting) for a long period of time.  People who are obese.  People who have rapid weight loss.  People who are pregnant.  People who have increased triglyceride levels.  People who have pancreatitis. SYMPTOMS Symptoms of this condition include:  Abdominal pain,  especially in the upper right area of the abdomen.  Abdominal tenderness or bloating.  Nausea.  Vomiting.  Fever.  Chills.  Yellowing of the skin and the whites of the eyes (jaundice). DIAGNOSIS This condition is diagnosed with a medical history and physical exam. You may also have other tests, including:  Imaging tests, such as:  An ultrasound of the gallbladder.  A CT scan of the abdomen.  A gallbladder nuclear scan (HIDA scan). This scan allows your health care provider to see the bile moving from your liver to your gallbladder and to your small intestine.  MRI.  Blood tests, such as:  A complete blood count, because the white blood cell count may be higher than normal.  Liver function tests, because some levels may be higher than normal with certain types of gallstones. TREATMENT Treatment may include:  Fasting for a certain amount of time.  IV fluids.  Medicine to treat pain or vomiting.  Antibiotic medicine.  Surgery to remove your gallbladder (cholecystectomy). This may happen immediately or at a later time. HOME CARE INSTRUCTIONS Home care will depend on your treatment. In general:  Take over-the-counter and prescription medicines only as told by your health care provider.  If you were prescribed an antibiotic medicine, take it as told by your health care provider. Do not stop taking the antibiotic even if you start to feel better.  Follow instructions from your health care provider about what to eat or drink. When you are allowed to eat, avoid eating or drinking anything that triggers  your symptoms.  Keep all follow-up visits as told by your health care provider. This is important. SEEK MEDICAL CARE IF:  Your pain is not controlled with medicine.  You have a fever. SEEK IMMEDIATE MEDICAL CARE IF:  Your pain moves to another part of your abdomen or to your back.  You continue to have symptoms or you develop new symptoms even with treatment.    This information is not intended to replace advice given to you by your health care provider. Make sure you discuss any questions you have with your health care provider.   Document Released: 06/21/2005 Document Revised: 03/12/2015 Document Reviewed: 10/02/2014 Elsevier Interactive Patient Education 2016 [employer]    IF you received an x-ray today, you will receive an invoice from [facility]. Please contact [facility] at [phone] with questions or concerns regarding your invoice.   IF you received labwork today, you will receive an invoice from [facility]. Please contact [facility] at [phone] with questions or concerns regarding your invoice.   Our billing staff will not be able to assist you with questions regarding bills from these companies.  You will be contacted with the lab results as soon as they are available. The fastest way to get your results is to activate your My Chart account. Instructions are located on the last page of this paperwork. If you have not heard from us regarding the results in 2 weeks, please contact this office.

## 2016-01-17 NOTE — Progress Notes (Signed)
Quick Note:  I already called this [facility] patient. We need to call him and get him an appointment with me at [facility].  Thanks ______

## 2016-01-17 NOTE — Addendum Note (Signed)
Addended by: Rodolph BongOREY, Zaidin Blyden S on: 01/17/2016 03:51 PM   Modules accepted: Orders

## 2016-01-20 ENCOUNTER — Ambulatory Visit (INDEPENDENT_AMBULATORY_CARE_PROVIDER_SITE_OTHER): Payer: Commercial Managed Care - HMO | Admitting: Family Medicine

## 2016-01-20 ENCOUNTER — Encounter: Payer: Self-pay | Admitting: Family Medicine

## 2016-01-20 VITALS — BP 135/78 | HR 63 | Temp 97.9°F | Ht 75.0 in | Wt 202.0 lb

## 2016-01-20 DIAGNOSIS — K859 Acute pancreatitis without necrosis or infection, unspecified: Secondary | ICD-10-CM | POA: Insufficient documentation

## 2016-01-20 DIAGNOSIS — K858 Other acute pancreatitis without necrosis or infection: Secondary | ICD-10-CM | POA: Diagnosis not present

## 2016-01-20 NOTE — Patient Instructions (Signed)
Thank you for coming in today. Get fasting labs at any [facility] location soon.  Get xray today.  Return as needed.   Acute Pancreatitis Acute pancreatitis is a disease in which the pancreas becomes suddenly inflamed. The pancreas is a large gland located behind your stomach. The pancreas produces enzymes that help digest food. The pancreas also releases the hormones glucagon and insulin that help regulate blood sugar. Damage to the pancreas occurs when the digestive enzymes from the pancreas are activated and begin attacking the pancreas before being released into the intestine. Most acute attacks last a couple of days and can cause serious complications. Some people become dehydrated and develop low blood pressure. In severe cases, bleeding into the pancreas can lead to shock and can be life-threatening. The lungs, heart, and kidneys may fail. CAUSES  Pancreatitis can happen to anyone. In some cases, the cause is unknown. Most cases are caused by:  Alcohol abuse.  Gallstones. Other less common causes are:  Certain medicines.  Exposure to certain chemicals.  Infection.  Damage caused by an accident (trauma).  Abdominal surgery. SYMPTOMS   Pain in the upper abdomen that may radiate to the back.  Tenderness and swelling of the abdomen.  Nausea and vomiting. DIAGNOSIS  Your caregiver will perform a physical exam. Blood and stool tests may be done to confirm the diagnosis. Imaging tests may also be done, such as X-rays, CT scans, or an ultrasound of the abdomen. TREATMENT  Treatment usually requires a stay in the hospital. Treatment may include:  Pain medicine.  Fluid replacement through an intravenous line (IV).  Placing a tube in the stomach to remove stomach contents and control vomiting.  Not eating for 3 or 4 days. This gives your pancreas a rest, because enzymes are not being produced that can cause further damage.  Antibiotic medicines if your condition is caused by an  infection.  Surgery of the pancreas or gallbladder. HOME CARE INSTRUCTIONS   Follow the diet advised by your caregiver. This may involve avoiding alcohol and decreasing the amount of fat in your diet.  Eat smaller, more frequent meals. This reduces the amount of digestive juices the pancreas produces.  Drink enough fluids to keep your urine clear or pale yellow.  Only take over-the-counter or prescription medicines as directed by your caregiver.  Avoid drinking alcohol if it caused your condition.  Do not smoke.  Get plenty of rest.  Check your blood sugar at home as directed by your caregiver.  Keep all follow-up appointments as directed by your caregiver. SEEK MEDICAL CARE IF:   You do not recover as quickly as expected.  You develop new or worsening symptoms.  You have persistent pain, weakness, or nausea.  You recover and then have another episode of pain. SEEK IMMEDIATE MEDICAL CARE IF:   You are unable to eat or keep fluids down.  Your pain becomes severe.  You have a fever or persistent symptoms for more than 2 to 3 days.  You have a fever and your symptoms suddenly get worse.  Your skin or the white part of your eyes turn yellow (jaundice).  You develop vomiting.  You feel dizzy, or you faint.  Your blood sugar is high (over 300 mg/dL). MAKE SURE YOU:   Understand these instructions.  Will watch your condition.  Will get help right away if you are not doing well or get worse.   This information is not intended to replace advice given to you by  your health care provider. Make sure you discuss any questions you have with your health care provider.   Document Released: 06/21/2005 Document Revised: 12/21/2011 Document Reviewed: 09/30/2011 Elsevier Interactive Patient Education 2016 Elsevier Inc.  Low-Fat Diet for Pancreatitis or Gallbladder Conditions A low-fat diet can be helpful if you have pancreatitis or a gallbladder condition. With these  conditions, your pancreas and gallbladder have trouble digesting fats. A healthy eating plan with less fat will help rest your pancreas and gallbladder and reduce your symptoms. WHAT DO I NEED TO KNOW ABOUT THIS DIET?  Eat a low-fat diet.  Reduce your fat intake to less than 20-30% of your total daily calories. This is less than 50-60 g of fat per day.  Remember that you need some fat in your diet. Ask your dietician what your daily goal should be.  Choose nonfat and low-fat healthy foods. Look for the words "nonfat," "low fat," or "fat free."  As a guide, look on the label and choose foods with less than 3 g of fat per serving. Eat only one serving.  Avoid alcohol.  Do not smoke. If you need help quitting, talk with your health care provider.  Eat small frequent meals instead of three large heavy meals. WHAT FOODS CAN I EAT? Grains Include healthy grains and starches such as potatoes, wheat bread, fiber-rich cereal, and brown rice. Choose whole grain options whenever possible. In adults, whole grains should account for 45-65% of your daily calories.  Fruits and Vegetables Eat plenty of fruits and vegetables. Fresh fruits and vegetables add fiber to your diet. Meats and Other Protein Sources Eat lean meat such as chicken and pork. Trim any fat off of meat before cooking it. Eggs, fish, and beans are other sources of protein. In adults, these foods should account for 10-35% of your daily calories. Dairy Choose low-fat milk and dairy options. Dairy includes fat and protein, as well as calcium.  Fats and Oils Limit high-fat foods such as fried foods, sweets, baked goods, sugary drinks.  Other Creamy sauces and condiments, such as mayonnaise, can add extra fat. Think about whether or not you need to use them, or use smaller amounts or low fat options. WHAT FOODS ARE NOT RECOMMENDED?  High fat foods, such as:  [employer]aked goods.  Ice cream.  [place] toast.  Sweet  rolls.  Pizza.  Cheese bread.  Foods covered with batter, butter, creamy sauces, or cheese.  Fried foods.  Sugary drinks and desserts.  Foods that cause gas or bloating   This information is not intended to replace advice given to you by your health care provider. Make sure you discuss any questions you have with your health care provider.   Document Released: 06/26/2013 Document Reviewed: 06/26/2013 Elsevier Interactive Patient Education [employer]2016 Elsevier Inc.

## 2016-01-20 NOTE — Progress Notes (Signed)
Steven Shields is a 25 y.o. male who [name] today for follow-up abdominal pain. Patient was seen in urgent care for significant right upper quadrant abdominal pain. Abdominal ultrasound showed only mild hepatomegaly, and his laboratory assessment was significant for mildly elevated lipase. He was treated with a clear liquid diet and tramadol. He notes is feeling much better now. He notes only minimal intermittent abdominal pain. 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 No fevers chills nausea vomiting or diarrhea.  When asked he notes that he does not drink much at all. He does note in the past he's has significantly elevated lipid panel.   Past Medical History  Diagnosis Date  . Acne   . Pectus excavatum    History reviewed. No pertinent past surgical history. Social History  Substance Use Topics  . Smoking status: Never Smoker   . Smokeless tobacco: Never Used  . Alcohol Use: 0.0 oz/week    0 Standard drinks or equivalent per week     Comment: occasionally   family history includes Diabetes in his maternal grandfather and mother.  ROS as above:  Medications: Current Outpatient Prescriptions  Medication Sig Dispense Refill  . traMADol (ULTRAM) 50 MG tablet Take 1 tablet (50 mg total) by mouth every 8 (eight) hours as needed. 10 tablet 0   No current facility-administered medications for this visit.   No Known Allergies   Exam:  BP 135/78 mmHg  Pulse 63  Temp(Src) 97.9 F (36.6 C) (Oral)  Ht 6\' 3"  (1.905 m)  Wt 202 lb (91.627 kg)  BMI 25.25 kg/m2  SpO2 99% Gen: Well NAD HEENT: EOMI,  MMM Lungs: Normal work of breathing. CTABL Heart: RRR no MRG Abd: NABS, Soft. Nondistended, Nontender Exts: Brisk capillary refill, warm and well perfused.     Chemistry      Component Value Date/Time   NA 138  01/17/2016 1156   K 4.2 01/17/2016 1156   CL 105 01/17/2016 1156   CO2 28 01/17/2016 1156   BUN 25 01/17/2016 1156   CREATININE 0.99 01/17/2016 1156   CREATININE 1.20 02/27/2015 1600      Component Value Date/Time   CALCIUM 9.3 01/17/2016 1156   ALKPHOS 68 01/17/2016 1156   AST 46* 01/17/2016 1156   ALT 37 01/17/2016 1156   BILITOT 1.0 01/17/2016 1156     Lab Results  Component Value Date   WBC 3.7* 01/17/2016   HGB 14.7 01/17/2016   HCT 42.3* 01/17/2016   MCV 85.5 01/17/2016   PLT 218 02/27/2015    Lab Results  Component Value Date   LIPASE 94* 01/17/2016    [place] Abdomen Complete  01/17/2016  CLINICAL DATA:  Right upper quadrant pain.  Pain for 2 days. EXAM: ABDOMEN ULTRASOUND COMPLETE COMPARISON:  None. FINDINGS: Gallbladder: No gallstones or wall thickening visualized. No sonographic Murphy sign noted by sonographer. Common bile duct: Diameter: 3 mm Liver: No focal lesion identified. Within normal limits in parenchymal echogenicity. IVC: No abnormality visualized. Pancreas: Visualized portion unremarkable. Spleen: Mildly enlarged spleen measuring 433 mL without focal abnormality. Right Kidney: Length: 12.2 cm. Echogenicity within normal limits. No mass or hydronephrosis visualized. Left Kidney: Length: 12.2 cm. Echogenicity within normal limits. No mass or hydronephrosis visualized. Abdominal aorta: No aneurysm visualized. Other findings: None. IMPRESSION: 1. No cholelithiasis or sonographic evidence of acute cholecystitis. 2. Mild splenomegaly. Electronically Signed   By: Alan RipperHetal  Patel   On: 01/17/2016 15:11      No results found for this or any previous visit (from the past 24 hour(s)). No results found.    Assessment and Plan: 25 y.o. male with right upper quadrant abdominal pain resolving. I suspect this was due to pancreatitis. The ultimate cause of pancreatitis at this time is somewhat of a mystery. I'm suspicious for significantly elevated serum triglycerides as a  cause for pancreatitis.  Plan for repeat labs including repeat CBC CMP lipase and new amylase lab. Additionally we'll check fasting lipids.  Plan for watchful waiting as patient is feeling much better now.  Discussed warning signs or symptoms. Please see discharge instructions. Patient expresses understanding.

## 2017-02-08 DIAGNOSIS — M9904 Segmental and somatic dysfunction of sacral region: Secondary | ICD-10-CM | POA: Diagnosis not present

## 2017-02-08 DIAGNOSIS — M9903 Segmental and somatic dysfunction of lumbar region: Secondary | ICD-10-CM | POA: Diagnosis not present

## 2017-02-08 DIAGNOSIS — M9905 Segmental and somatic dysfunction of pelvic region: Secondary | ICD-10-CM | POA: Diagnosis not present

## 2017-03-03 DIAGNOSIS — M9904 Segmental and somatic dysfunction of sacral region: Secondary | ICD-10-CM | POA: Diagnosis not present

## 2017-03-03 DIAGNOSIS — M9905 Segmental and somatic dysfunction of pelvic region: Secondary | ICD-10-CM | POA: Diagnosis not present

## 2017-03-03 DIAGNOSIS — M9903 Segmental and somatic dysfunction of lumbar region: Secondary | ICD-10-CM | POA: Diagnosis not present

## 2017-07-11 DIAGNOSIS — Z0100 Encounter for examination of eyes and vision without abnormal findings: Secondary | ICD-10-CM | POA: Diagnosis not present

## 2017-09-29 ENCOUNTER — Ambulatory Visit
Admission: RE | Admit: 2017-09-29 | Discharge: 2017-09-29 | Disposition: A | Discharge status: Home / Self Care | Payer: 59 | Source: Ambulatory Visit | Attending: Emergency Medicine | Admitting: Emergency Medicine

## 2017-09-29 ENCOUNTER — Ambulatory Visit (INDEPENDENT_AMBULATORY_CARE_PROVIDER_SITE_OTHER): Payer: 59 | Admitting: Emergency Medicine

## 2017-09-29 ENCOUNTER — Encounter: Payer: Self-pay | Admitting: Emergency Medicine

## 2017-09-29 VITALS — BP 144/72 | HR 67 | Temp 97.9°F | Resp 18 | Wt 220.0 lb

## 2017-09-29 DIAGNOSIS — R319 Hematuria, unspecified: Secondary | ICD-10-CM | POA: Diagnosis not present

## 2017-09-29 DIAGNOSIS — R109 Unspecified abdominal pain: Secondary | ICD-10-CM

## 2017-09-29 DIAGNOSIS — N200 Calculus of kidney: Secondary | ICD-10-CM

## 2017-09-29 LAB — POCT URINALYSIS DIPSTICK
BILIRUBIN UA: NEGATIVE
Glucose, UA: NEGATIVE
Ketones, UA: NEGATIVE
Nitrite, UA: NEGATIVE
Protein, UA: NEGATIVE
Spec Grav, UA: 1.015 (ref 1.010–1.025)
Urobilinogen, UA: NEGATIVE E.U./dL — AB
pH, UA: 6 (ref 5.0–8.0)

## 2017-09-29 MED ORDER — TAMSULOSIN HCL 0.4 MG PO CAPS: 0.4000 mg | ORAL_CAPSULE | Freq: Every day | ORAL | 0 refills | Status: DC

## 2017-09-29 MED ORDER — KETOROLAC TROMETHAMINE 10 MG PO TABS: 10.0000 mg | ORAL_TABLET | Freq: Four times a day (QID) | ORAL | 0 refills | Status: DC | PRN

## 2017-09-29 MED FILL — KETOROLAC 10 MG TABLET: 10 | 5 days supply | Qty: 20 | Fill #0

## 2017-09-29 MED FILL — TAMSULOSIN HCL 0.4 MG CAP: 0.4 | 14 days supply | Qty: 14 | Fill #0

## 2017-09-29 NOTE — Progress Notes (Signed)
Please contact patient with results of KUB. Possible left ureteral stone noted and consistent with urinalysis results.  Referral made to [facility] for further evaluation and follow up as needed. Also inform of incidental finding of constipation with high stool load. Recommend high fiber diet, whole grains, fresh fruits and vegetables, can supplement with OTC stool softeners as needed.

## 2017-09-29 NOTE — Patient Instructions (Signed)
Flank Pain Flank pain is pain in your side. The flank is the area of your side between your upper belly (abdomen) and your back. The pain may occur over a short period of time (acute) or may be long-term or come back often (chronic). It may be mild or very bad. Pain in this area can be caused by many different things. Follow these instructions at home:  Rest as told by your doctor.  Drink enough fluid to keep your pee (urine) clear or pale yellow.  Take over-the-counter and prescription medicines only as told by your doctor.  Keep all follow-up visits as told by your doctor. This is important. Contact a doctor if:  Medicine does not help your pain.  You have new symptoms.  Your pain gets worse.  You have a fever.  Your symptoms last longer than 2-3 days. Get help right away if:  Your tummy hurts or is swollen.  You are short of breath.  You feel sick to your stomach (nauseous) and it does not go away.  You cannot stop throwing up (vomiting).  You feel like you will pass out or you do pass out (faint).  You have blood in your pee.  You have a fever and your symptoms suddenly get worse. This information is not intended to replace advice given to you by your health care provider. Make sure you discuss any questions you have with your health care provider. Document Released: 03/30/2008 Document Revised: 03/12/2016 Document Reviewed: 03/25/2015 Elsevier Interactive Patient Education  2018 Elsevier Inc.  

## 2017-09-29 NOTE — Progress Notes (Signed)
Called patient and notified him of kub results,possible kidney stone. notified him that the provider put in for a referral to [facility] and that  He has some constipation to take otc stool softener. Patient stated he understood and thanked me for the call

## 2017-09-29 NOTE — Addendum Note (Signed)
Addended by: Adrian PrinceKENNARD, Errin Chewning A on: 09/29/2017 10:25 AM   Modules accepted: Orders

## 2017-09-29 NOTE — Progress Notes (Addendum)
S: Steven Shields is a 27 y.o. male who presents for two day history of right flank pain and darkened urine. He reports he has been drinking fluids and urine has cleared. Pain has gradually been worsening, worse with movement. Denies history of trauma, past history of kidney stones, or history of lifting or twisting. He denies discharge, dysuria, frequency, or urgency. No difficulty initiating stream no feeling of retention after urination.   Review of Systems  Constitutional: Negative for chills, fever and malaise/fatigue.  Gastrointestinal: Negative for abdominal pain, nausea and vomiting.  Genitourinary: Positive for flank pain and hematuria. Negative for dysuria, frequency and urgency.  Neurological: Negative for dizziness.    O:  Vitals:   09/29/17 0817  BP: (!) 144/72  Pulse: 67  Resp: 18  Temp: 97.9 F (36.6 C)  SpO2: 98%  Physical Exam  Constitutional: He appears well-developed. No distress.  Abdominal: Soft. Bowel sounds are normal. There is no tenderness. There is no CVA tenderness.  Neurological: He is alert.  Skin: Skin is warm and dry. Capillary refill takes less than 2 seconds.  Nursing note and vitals reviewed.  Results for orders placed or performed in visit on 09/29/17  POCT Urinalysis Dipstick  Result Value Ref Range   Color, UA     Clarity, UA     Glucose, UA negative    Bilirubin, UA negative    Ketones, UA negative    Spec Grav, UA 1.015 1.010 - 1.025   Blood, UA large    pH, UA 6.0 5.0 - 8.0   Protein, UA negative    Urobilinogen, UA negative (A) 0.2 or 1.0 E.U./dL   Nitrite, UA negative    Leukocytes, UA Moderate (2+) (A) Negative   Appearance     Odor       A:  1. Acute right flank pain     P: Moderate hematuria without evidence of infection. Will obtain KUB and treat empirically with Toradol and Flomax. Will notify patient of results, recommend follow up with urology or ER if symptoms persist past 5 days.  Addendum:   Narrative    CLINICAL DATA: Right flank pain. Hematuria.  EXAM: ABDOMEN - 1 VIEW  COMPARISON: Ultrasound 01/17/2016.  FINDINGS: Soft tissue structures are unremarkable. Prominent amount of stool noted throughout the colon. Constipation may be present. No bowel distention or free air. Tiny density is noted projected over the left upper sacrum, this most likely tiny bone island. Left ureteral stone cannot be completely excluded. No other evidence of renal or ureteral stone disease.  IMPRESSION: 1. Large amount of stool noted throughout the colon. Constipation may be present. No bowel distention.  2. Tiny density noted projected over the upper sacrum on the left. This is most likely a tiny bone island. No other focal abnormalities noted. No other evidence of renal or ureteral stone.   Electronically Signed By: Maisie Fushomas Register On: 09/29/2017 10:05    Will provide referral to urology for further management of hematuria and possible ureter stone.

## 2017-10-04 ENCOUNTER — Telehealth: Payer: Self-pay

## 2017-10-04 NOTE — Telephone Encounter (Signed)
Called and spoke with pt tcb regarding how pt Is feeling and he states he does not thing it has passed yet, he is still having the same symptoms.

## 2017-10-31 DIAGNOSIS — H20012 Primary iridocyclitis, left eye: Secondary | ICD-10-CM | POA: Diagnosis not present

## 2017-10-31 MED FILL — PREDNISOLONE AC 1% EYE DROP: 1 | 12 days supply | Qty: 5 | Fill #0

## 2017-11-02 DIAGNOSIS — H20012 Primary iridocyclitis, left eye: Secondary | ICD-10-CM | POA: Diagnosis not present

## 2017-11-25 ENCOUNTER — Encounter: Payer: Self-pay | Admitting: Internal Medicine

## 2017-11-25 ENCOUNTER — Ambulatory Visit (INDEPENDENT_AMBULATORY_CARE_PROVIDER_SITE_OTHER): Payer: 59 | Admitting: Internal Medicine

## 2017-11-25 VITALS — BP 110/76 | HR 57 | Ht 75.0 in | Wt 219.0 lb

## 2017-11-25 DIAGNOSIS — G4730 Sleep apnea, unspecified: Secondary | ICD-10-CM

## 2017-11-25 DIAGNOSIS — G4733 Obstructive sleep apnea (adult) (pediatric): Secondary | ICD-10-CM | POA: Diagnosis not present

## 2017-11-25 DIAGNOSIS — F5101 Primary insomnia: Secondary | ICD-10-CM

## 2017-11-25 MED ORDER — ZALEPLON 10 MG PO CAPS: 10.0000 mg | ORAL_CAPSULE | Freq: Every evening | ORAL | 5 refills | Status: DC | PRN

## 2017-11-25 MED FILL — ZALEPLON 10 MG CAPSULE: 10 | 30 days supply | Qty: 30 | Fill #0

## 2017-11-25 NOTE — Patient Instructions (Signed)
Order- please schedule unattended home sleep test   Dx OSA   Please call me about 2 weeks after your sleep test, for results and recommendations   Script printed for Sonata/ zaleplon 10 mg,   1 for sleep at night as needed

## 2017-11-25 NOTE — Progress Notes (Signed)
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  His work schedule is on 24, off 48 hours.  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  Initiating and maintaining sleep with frequent waking.  Fights daytime tiredness.  Told by men at the fire station that he snores.  Denies history of ENT surgery, heart or lung condition.  Admits seasonal and perennial nasal congestion. Having trouble going to sleep. Snoring at night, for about a year now.   Uses smokeless tobacco Epworth score 19  Prior to Admission medications   Medication Sig Start Date End Date Taking? Authorizing Provider  zaleplon (SONATA) 10 MG capsule Take 1 capsule (10 mg total) by mouth at bedtime as needed for sleep. 11/25/17   Waymon Budge, MD   Past Medical History:  Diagnosis Date  . Acne   . Pectus excavatum    History reviewed. No pertinent surgical history. Family History  Problem Relation Age of Onset  . Diabetes Mother        type 1  . Diabetes Maternal Grandfather    Social History   Socioeconomic History  . Marital status: Single    Spouse name: Not on file  . Number of children: Not on file  . Years of education: Not on file  . Highest education level: Not on file  Occupational History  . Occupation: Lobbyist  . Financial resource strain: Not on file  . Food insecurity:    Worry: Not on file    Inability: Not on file  . Transportation needs:    Medical: Not on file    Non-medical: Not on file  Tobacco Use  . Smoking status: Never Smoker  . Smokeless tobacco: Current User    Types: Chew  Substance and Sexual Activity  . Alcohol use: Yes    Alcohol/week: 0.0 oz    Comment: occasionally  . Drug use: No  . Sexual activity: Not on file  Lifestyle  . Physical activity:    Days per week: Not on file    Minutes per session: Not on file  . Stress: Not on file  Relationships  . Social connections:    Talks on phone: Not on file    Gets together: Not on file    Attends religious service: Not on file    Active member of club or organization: Not on file    Attends meetings of clubs or organizations: Not on file    Relationship status: Not on file  . Intimate partner violence:    Fear of current or ex partner: Not on file    Emotionally abused: Not on file    Physically abused: Not on file    Forced sexual activity: Not on file  Other Topics Concern  . Not on file  Social History Narrative  . Not on file   ROS-see HPI   Negative unless "+" Constitutional:    weight loss, night sweats, fevers, chills, fatigue, lassitude. HEENT:    headaches, difficulty swallowing, tooth/dental problems, sore throat,       sneezing, itching, ear ache, nasal congestion, post nasal drip, snoring CV:    chest pain, orthopnea, PND, swelling in lower extremities, anasarca,                                  dizziness, palpitations Resp:   shortness of breath with exertion or at rest.  productive cough,   non-productive cough, coughing up of blood.              change in color of mucus.  wheezing.   Skin:    rash or lesions. GI:  No-   heartburn, indigestion, abdominal pain, nausea, vomiting, diarrhea,                 change in bowel habits, loss of appetite GU: dysuria, change in color of urine, no urgency or frequency.   flank pain. MS:   joint pain, stiffness, decreased range of motion, back pain. Neuro-     nothing unusual Psych:  change in mood or affect.  depression or anxiety.   memory loss.  OBJ- Physical Exam General- Alert, Oriented, Affect-appropriate, Distress- none acute, + tall/ lean [place] man Skin- rash-none, lesions- none, excoriation- none Lymphadenopathy- none Head- atraumatic            Eyes- Gross vision intact, PERRLA, conjunctivae and secretions clear            Ears- Hearing, canals-normal            Nose- +turbinate edema, no-Septal dev, mucus, polyps, erosion, perforation             Throat- Mallampati II , mucosa clear ,  drainage- none, tonsils- atrophic Neck- flexible , trachea midline, no stridor , thyroid nl, carotid no bruit Chest - symmetrical excursion , unlabored           Heart/CV- RRR , no murmur , no gallop  , no rub, nl s1 s2                           - JVD- none , edema- none, stasis changes- none, varices- none           Lung- clear to P&A, wheeze- none, cough- none , dullness-none, rub- none           Chest wall-  Abd-  Br/ Gen/ Rectal- Not done, not indicated Extrem- cyanosis- none, clubbing, none, atrophy- none, strength- nl Neuro- grossly intact to observation

## 2017-11-26 DIAGNOSIS — G4733 Obstructive sleep apnea (adult) (pediatric): Secondary | ICD-10-CM | POA: Insufficient documentation

## 2017-11-26 DIAGNOSIS — G4700 Insomnia, unspecified: Secondary | ICD-10-CM | POA: Insufficient documentation

## 2017-11-26 NOTE — Assessment & Plan Note (Signed)
He is having trouble getting his mind to shut down when he lies down for sleep.  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  Melatonin and OTC sleep meds did not help him. Plan-we discussed trial of Sonata, which has a short half-life, if needed on nights off work.

## 2017-11-26 NOTE — Assessment & Plan Note (Signed)
His body habitus does not suggest obstructive sleep apnea but that is still a possibility. Plan-schedule home sleep study.  We discussed the possibility of CPAP.

## 2017-12-26 DIAGNOSIS — G4733 Obstructive sleep apnea (adult) (pediatric): Secondary | ICD-10-CM | POA: Diagnosis not present

## 2017-12-27 ENCOUNTER — Other Ambulatory Visit: Payer: Self-pay | Admitting: *Deleted

## 2017-12-27 DIAGNOSIS — G4733 Obstructive sleep apnea (adult) (pediatric): Secondary | ICD-10-CM

## 2018-01-09 ENCOUNTER — Telehealth: Payer: Self-pay | Admitting: Internal Medicine

## 2018-01-09 DIAGNOSIS — G4733 Obstructive sleep apnea (adult) (pediatric): Secondary | ICD-10-CM

## 2018-01-09 NOTE — Telephone Encounter (Signed)
Home Sleep Test- showed moderate obstructive sleep apnea, averaging 26 apneas/ hour with drops in blood oxygen level.  Recommend we start CPAP   Order- new DME, new CPAP auto 5-20, mask of choice, humidifier, supplies, AirView   Dx OSA  Please make sure he has a return ov in 31-90 days, per insurance regulations.

## 2018-01-09 NOTE — Telephone Encounter (Signed)
Called pt but unable to reach him.  Left message for pt to return call x1. 

## 2018-01-09 NOTE — Telephone Encounter (Signed)
Dr. [name], please advise on results of pt's HST.  Thanks!

## 2018-01-09 NOTE — Telephone Encounter (Signed)
Patient returned call, CB is [phone]

## 2018-01-09 NOTE — Telephone Encounter (Signed)
Spoke with pt, aware of results/recs.  cpap ordered.  Pt scheduled to see CY on 8/27.  Nothing further needed at this time.

## 2018-01-10 DIAGNOSIS — L700 Acne vulgaris: Secondary | ICD-10-CM | POA: Diagnosis not present

## 2018-01-10 MED FILL — ADAPALENE 0.3% GEL: 0.3 | 30 days supply | Qty: 45 | Fill #0

## 2018-01-10 MED FILL — AMPICILLIN TR 500 MG CAP: 500 | 30 days supply | Qty: 30 | Fill #0

## 2018-02-03 MED FILL — ZALEPLON 10 MG CAPSULE: 10 | 30 days supply | Qty: 30 | Fill #1

## 2018-02-06 MED FILL — AMPICILLIN TR 500 MG CAP: 500 | 30 days supply | Qty: 30 | Fill #1

## 2018-02-07 DIAGNOSIS — G4733 Obstructive sleep apnea (adult) (pediatric): Secondary | ICD-10-CM | POA: Diagnosis not present

## 2018-02-28 ENCOUNTER — Ambulatory Visit (INDEPENDENT_AMBULATORY_CARE_PROVIDER_SITE_OTHER): Payer: 59 | Admitting: Primary Care

## 2018-02-28 ENCOUNTER — Ambulatory Visit: Payer: 59 | Admitting: Internal Medicine

## 2018-02-28 ENCOUNTER — Encounter: Payer: Self-pay | Admitting: Primary Care

## 2018-02-28 VITALS — BP 104/78 | HR 64 | Ht 75.0 in | Wt 208.0 lb

## 2018-02-28 DIAGNOSIS — G4733 Obstructive sleep apnea (adult) (pediatric): Secondary | ICD-10-CM

## 2018-02-28 NOTE — Patient Instructions (Addendum)
Needs sleep titration study, follow up visit 6 weeks after with Dr. [name]   Do not drive if you have excessive daytime fatigue  Goal is to wear CPAP mask 4-6 hours EVERY night (try your best!)

## 2018-02-28 NOTE — Assessment & Plan Note (Signed)
PRN sonata with some improvement but still has trouble falling and staying asleep

## 2018-02-28 NOTE — Assessment & Plan Note (Addendum)
Poor compliance with CPAP, feels it does not help and cant keep mask on at night   - Needs sleep titration study with fu 6 week after  - Given sample dreamwear full face mask size MW  - Reinforced that patient needs to wear mask every night for 4-6 hours  - Do not drive if experiencing excessive daytime fatigue

## 2018-02-28 NOTE — Progress Notes (Signed)
@Patient  ID: Steven Shields, male    DOB: Jun 24, 1991, 27 y.o.   MRN: [id number]  Chief Complaint  Patient presents with  . Follow-up    follow up on cpap    Referring provider: No ref. provider found  HPI: 27 year old, never smoked. Hx insomnia and sleep apnea. Patient of Dr. Maple Hudson, last seen in May 2019. Patient works as a Company secretary with irregular hours. Reports trouble falling asleep and daytime fatigue. Started on Sonata 10mg  prn insomnia. HST showed AHI 26. CPAP ordered, auto titrate 5-20cm H20 with mask of choice.  02/28/2018 Presents today for OSA follow-up. Patient is not tolerating new cpap, states that he has a hard time keeping his mask on at night. Reports that it takes him forever to fall asleep. PRN sonata with some improvement. He think he is taking mask off in the middle of the night without realizing it. States that he cuts the pressure off as its "ramping up". 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 Does not require commercial drivers license. Got his CPAP machine approx 1 month ago, using full face mask. Download reviewed showing 43% compliance in 30 days (13/30days). Average time of wear on days used was 2 hours and 9 mins. AHI 6.2. Minimal leaks.    No Known Allergies  Immunization History  Administered Date(s) Administered  . Tdap 10/14/2011    Past Medical History:  Diagnosis Date  . Acne   . Pectus excavatum     Tobacco History: Social History   Tobacco Use  Smoking Status Never Smoker  Smokeless Tobacco Current User  . Types: Chew   Ready to quit: Not Answered Counseling given: Not Answered   Outpatient Medications Prior to Visit  Medication Sig Dispense Refill  . zaleplon (SONATA) 10 MG capsule Take 1 capsule (10 mg total) by mouth at bedtime as needed for sleep. 30 capsule 5   No facility-administered medications prior to visit.     Review of Systems  Review of Systems   Constitutional: Positive for fatigue.  Psychiatric/Behavioral: Positive for sleep disturbance.    Physical Exam  BP 104/78 (BP Location: Right Arm, Cuff Size: Normal)   Pulse 64   Ht 6\' 3"  (1.905 m)   Wt 208 lb (94.3 kg)   SpO2 97%   BMI 26.00 kg/m  Physical Exam  Constitutional: He is oriented to person, place, and time. He appears well-nourished.  HENT:  Head: Normocephalic and atraumatic.  Eyes: Pupils are equal, round, and reactive to light. EOM are normal.  Neck: Normal range of motion. Neck supple.  Cardiovascular: Normal rate.  Pulmonary/Chest: Effort normal.  Musculoskeletal: Normal range of motion.  Neurological: He is alert and oriented to person, place, and time.  Psychiatric: His behavior is normal. Judgment and thought content normal.     Lab Results:  CBC    Component Value Date/Time   WBC 3.7 (A) 01/17/2016 1203   WBC 5.0 02/27/2015 1600   RBC 4.94 01/17/2016 1203   RBC 5.55 02/27/2015 1600   HGB 14.7 01/17/2016 1203   HGB 16.5 02/27/2015 1600   HCT 42.3 (A) 01/17/2016 1203   HCT 47.9 02/27/2015 1600   PLT 218 02/27/2015 1600   MCV 85.5 01/17/2016 1203   MCH 29.7 01/17/2016 1203   MCH 29.7 02/27/2015 1600   MCHC 34.7 01/17/2016 1203   MCHC 34.4 02/27/2015 1600   RDW 12.4 02/27/2015 1600   LYMPHSABS  0.8 02/27/2015 1600   MONOABS 1.1 (H) 02/27/2015 1600   EOSABS 0.2 02/27/2015 1600   BASOSABS 0.0 02/27/2015 1600    BMET    Component Value Date/Time   NA 138 01/17/2016 1156   K 4.2 01/17/2016 1156   CL 105 01/17/2016 1156   CO2 28 01/17/2016 1156   GLUCOSE 95 01/17/2016 1156   BUN 25 01/17/2016 1156   CREATININE 0.99 01/17/2016 1156   CALCIUM 9.3 01/17/2016 1156   GFRNONAA >60 02/27/2015 1600   GFRAA >60 02/27/2015 1600    BNP No results found for: BNP  ProBNP No results found for: PROBNP  Imaging: No results found.   Assessment & Plan:   OSA (obstructive sleep apnea) Poor compliance with CPAP, feels it does not help and  cant keep mask on at night   - Needs sleep titration study with fu 6 week after  - Given sample dreamwear full face mask size MW  - Reinforced that patient needs to wear mask every night for 4-6 hours  - Do not drive if experiencing excessive daytime fatigue   Insomnia PRN sonata with some improvement but still has trouble falling and staying asleep      Glenford BayleyElizabeth W Loanne Emery, NP 02/28/2018

## 2018-03-07 MED FILL — AMPICILLIN TR 500 MG CAP: 500 | 30 days supply | Qty: 30 | Fill #2

## 2018-03-10 DIAGNOSIS — G4733 Obstructive sleep apnea (adult) (pediatric): Secondary | ICD-10-CM | POA: Diagnosis not present

## 2018-03-22 MED FILL — ZALEPLON 10 MG CAPSULE: 10 | 30 days supply | Qty: 30 | Fill #2

## 2018-04-04 ENCOUNTER — Ambulatory Visit (HOSPITAL_BASED_OUTPATIENT_CLINIC_OR_DEPARTMENT_OTHER): Payer: 59 | Attending: Primary Care | Admitting: Pulmonary Disease

## 2018-04-04 VITALS — Ht 75.0 in | Wt 210.0 lb

## 2018-04-04 DIAGNOSIS — G4733 Obstructive sleep apnea (adult) (pediatric): Secondary | ICD-10-CM | POA: Insufficient documentation

## 2018-04-07 DIAGNOSIS — G4733 Obstructive sleep apnea (adult) (pediatric): Secondary | ICD-10-CM | POA: Diagnosis not present

## 2018-04-07 MED FILL — AMPICILLIN TR 500 MG CAP: 500 | 30 days supply | Qty: 30 | Fill #3

## 2018-04-07 NOTE — Procedures (Signed)
  Patient Name: Steven Shields, Steven Shields  Study Date: 04/04/2018   Gender: Male  D.O.B: [birth year]  Age (years): 27  Referring Provider: Ames Dura NP  Height (inches): 75  Interpreting Physician: Cyril Mourning MD, ABSM  Weight (lbs): 210  RPSGT: Armen Pickup  BMI: 26  MRN: [id number]  Neck Size: 15.00  <br> <br>  CLINICAL INFORMATION  The patient is referred for a CPAP titration to treat sleep apnea. Date of HST: 12/2017, moderate OSA, AHI 26/h SLEEP STUDY TECHNIQUE  As per the AASM Manual for the Scoring of Sleep and Associated Events v2.3 (April 2016) with a hypopnea requiring 4% desaturations. The channels recorded and monitored were frontal, central and occipital EEG, electrooculogram (EOG), submentalis EMG (chin), nasal and oral airflow, thoracic and abdominal wall motion, anterior tibialis EMG, snore microphone, electrocardiogram, and pulse oximetry. Continuous positive airway pressure (CPAP) was initiated at the beginning of the study and titrated to treat sleep-disordered breathing. MEDICATIONS  Medications self-administered by patient taken the night of the study : ZALEPLON, AMPICILLIN TECHNICIAN COMMENTS  Comments added by technician: NO RESTROOM VISTED Comments added by scorer: N/A  RESPIRATORY PARAMETERS  Optimal PAP Pressure (cm): 6 AHI at Optimal Pressure (/hr): 0.0  Overall Minimal O2 (%): 93.0 Supine % at Optimal Pressure (%): 100  Minimal O2 at Optimal Pressure (%): 93.0      SLEEP ARCHITECTURE  The study was initiated at 10:11:12 PM and ended at 4:14:26 AM. Sleep onset time was 29.4 minutes and the sleep efficiency was 90.3%%. The total sleep time was 328 minutes. The patient spent 2.1%% of the night in stage N1 sleep, 67.7%% in stage N2 sleep, 13.0%% in stage N3 and 17.2% in REM.Stage REM latency was 63.5 minutes Wake after sleep onset was 5.8. Alpha intrusion was absent. Supine sleep was 100.00%. CARDIAC DATA  The 2 lead EKG demonstrated sinus rhythm. The mean  heart rate was 46.9 beats per minute. Other EKG findings include: None.  LEG MOVEMENT DATA  The total Periodic Limb Movements of Sleep (PLMS) were 0. The PLMS index was 0.0. A PLMS index of <15 is considered normal in adults. IMPRESSIONS  - The optimal PAP pressure was 6 cm of water.  - Central sleep apnea was not noted during this titration (CAI = 0.0/h).  - Significant oxygen desaturations were not observed during this titration (min O2 = 93.0%).  - No snoring was audible during this study.  - No cardiac abnormalities were observed during this study.  - Clinically significant periodic limb movements were not noted during this study. Arousals associated with PLMs were rare. DIAGNOSIS  - Obstructive Sleep Apnea (327.23 [G47.33 ICD-10]) RECOMMENDATIONS  - Trial of CPAP therapy on 6 cm H2O with a Small size Resmed Full Face Mask AirFit F30 mask and heated humidification. - Avoid alcohol, sedatives and other CNS depressants that may worsen sleep apnea and disrupt normal sleep architecture.  - Sleep hygiene should be reviewed to assess factors that may improve sleep quality.  - Return to [facility] for re-evaluation after 4 weeks of therapy   Cyril Mourning MD Board Certified in Sleep medicine

## 2018-04-09 DIAGNOSIS — G4733 Obstructive sleep apnea (adult) (pediatric): Secondary | ICD-10-CM | POA: Diagnosis not present

## 2018-04-10 ENCOUNTER — Other Ambulatory Visit: Payer: Self-pay

## 2018-04-10 ENCOUNTER — Telehealth: Payer: Self-pay | Admitting: Primary Care

## 2018-04-10 DIAGNOSIS — G4733 Obstructive sleep apnea (adult) (pediatric): Secondary | ICD-10-CM

## 2018-04-10 NOTE — Telephone Encounter (Signed)
Patient has cpap. Completed titration study. Please change setting to 6cm H20. Needs small size Resmed Full Face Mask AirFit F30 mask and heated humidification. Follow up in 4 weeks

## 2018-04-10 NOTE — Telephone Encounter (Signed)
Put in order to change setting to 6cm H2O and new mask, humidification and heat.

## 2018-04-11 ENCOUNTER — Ambulatory Visit: Payer: 59 | Admitting: Primary Care

## 2018-05-10 DIAGNOSIS — G4733 Obstructive sleep apnea (adult) (pediatric): Secondary | ICD-10-CM | POA: Diagnosis not present

## 2018-05-10 MED FILL — AMPICILLIN TR 500 MG CAP: 500 | 30 days supply | Qty: 30 | Fill #0

## 2018-05-23 MED FILL — ADAPALENE 0.3% GEL: 0.3 | 30 days supply | Qty: 45 | Fill #0

## 2018-05-29 ENCOUNTER — Ambulatory Visit: Payer: 59 | Admitting: Primary Care

## 2018-05-29 ENCOUNTER — Encounter: Payer: Self-pay | Admitting: Nurse Practitioner

## 2018-05-29 ENCOUNTER — Ambulatory Visit (INDEPENDENT_AMBULATORY_CARE_PROVIDER_SITE_OTHER): Payer: 59 | Admitting: Nurse Practitioner

## 2018-05-29 VITALS — BP 133/70 | HR 67 | Ht 75.0 in | Wt 208.6 lb

## 2018-05-29 DIAGNOSIS — G4700 Insomnia, unspecified: Secondary | ICD-10-CM

## 2018-05-29 DIAGNOSIS — G4733 Obstructive sleep apnea (adult) (pediatric): Secondary | ICD-10-CM | POA: Diagnosis not present

## 2018-05-29 MED ORDER — TRAZODONE HCL 50 MG PO TABS: 50.0000 mg | ORAL_TABLET | Freq: Every day | ORAL | 0 refills | Status: DC

## 2018-05-29 MED FILL — traZODone HCL 50 MG TABS: 50 | 30 days supply | Qty: 30 | Fill #0

## 2018-05-29 NOTE — Patient Instructions (Addendum)
Continues to have issues with CPAP and insomnia  Insomnia: Will trial trazodone 50 mg 1-2 tablets at bedtime as needed for sleep Please keep sleep diary Handout given on sleep hygiene  Sleep apnea recommendations: Will order CPAP desensitization and mask fitting at sleep lab  Change CPAP settings to AutoSet 5-15 cmH20  Note: patient refuses desensitization and mask fitting He does agree to trial of trazodone  He agrees to trial of CPAP at new settings He will keep sleep diary for upcoming visit with Dr. [name]  Follow up with Dr. [name] in 4 weeks or sooner if needed

## 2018-05-29 NOTE — Assessment & Plan Note (Signed)
Patient Instructions  Continues to have issues with CPAP and insomnia  Insomnia: Will trial trazodone 50 mg 1-2 tablets at bedtime as needed for sleep Please keep sleep diary Handout given on sleep hygiene  Sleep apnea recommendations: Will order CPAP desensitization and mask fitting at sleep lab  Change CPAP settings to AutoSet 5-15 cmH20  Note: patient refuses desensitization and mask fitting He does agree to trial of trazodone  He agrees to trial of CPAP at new settings He will keep sleep diary for upcoming visit with Dr. [name]  Follow up with Dr. [name] in 4 weeks or sooner if needed

## 2018-05-29 NOTE — Progress Notes (Signed)
 @Patient  ID: Steven Shields, male    DOB: Sep 05, 1990, 27 y.o.   MRN: [id number]  Chief Complaint  Patient presents with  . Follow-up    Referring provider: No ref. provider found  HPI 27 year old male never smoker with a history of insomnia and OSA followed by Dr. [name].  Tests: HST 12/26/17 - moderate sleep apnea - AHI 26.  OV 05/29/18 - follow up / /Patient presents for a follow up on CPAP. [facility] and was shown to be non compliant with CPAP. He was ordered a CPAP titration study and CPAP settings were changed to 6 cm H20 per CPAP recommendations. He states that he still cannot tolerate CPAP. Continues to pull it off at night. He states that he also has trouble falling asleep. He is on Sonata at 10 mg at bedtime PRN. States that it does not work for him. 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 He denies any shortness of breath, fever, or chest pain.     No Known Allergies  Immunization History  Administered Date(s) Administered  . Tdap 10/14/2011    Past Medical History:  Diagnosis Date  . Acne   . Pectus excavatum     Tobacco History: Social History   Tobacco Use  Smoking Status Never Smoker  Smokeless Tobacco Current User  . Types: Chew   Ready to quit: No Counseling given: Yes   Outpatient Encounter Medications as of 05/29/2018  Medication Sig  . Adapalene 0.3 % gel   . ampicillin (PRINCIPEN) 500 MG capsule   . traZODone (DESYREL) 50 MG tablet Take 1 tablet (50 mg total) by mouth at bedtime.  . zaleplon (SONATA) 10 MG capsule Take 1 capsule (10 mg total) by mouth at bedtime as needed for sleep. (Patient not taking: Reported on 05/29/2018)   No facility-administered encounter medications on file as of 05/29/2018.      Review of Systems  Review of Systems  Constitutional: Negative.  Negative for chills and fever.  HENT: Negative.   Respiratory:  Negative for cough, shortness of breath and wheezing.   Cardiovascular: Negative.  Negative for chest pain, palpitations and leg swelling.  Gastrointestinal: Negative.   Allergic/Immunologic: Negative.   Neurological: Negative.   Psychiatric/Behavioral: Negative.        Physical Exam  BP 133/70 (BP Location: Left Arm, Patient Position: Sitting, Cuff Size: Normal)   Pulse 67   Ht 6\' 3"  (1.905 m)   Wt 208 lb 9.6 oz (94.6 kg)   SpO2 97%   BMI 26.07 kg/m   Wt Readings from Last 5 Encounters:  05/29/18 208 lb 9.6 oz (94.6 kg)  04/04/18 210 lb (95.3 kg)  02/28/18 208 lb (94.3 kg)  11/25/17 219 lb (99.3 kg)  09/29/17 220 lb (99.8 kg)     Physical Exam  Constitutional: He is oriented to person, place, and time. He appears well-developed and well-nourished. No distress.  Cardiovascular: Normal rate and regular rhythm.  Pulmonary/Chest: Effort normal and breath sounds normal.  Neurological: He is alert and oriented to person, place, and time.  Skin: Skin is warm and dry.  Psychiatric: He has a normal mood and affect.  Nursing note and vitals reviewed.      Assessment & Plan:   Insomnia Patient Instructions  Continues to have issues with CPAP and insomnia  Insomnia: Will trial trazodone 50 mg 1-2 tablets at bedtime as needed  for sleep Please keep sleep diary Handout given on sleep hygiene  Sleep apnea recommendations: Will order CPAP desensitization and mask fitting at sleep lab  Change CPAP settings to AutoSet 5-15 cmH20  Note: patient refuses desensitization and mask fitting He does agree to trial of trazodone  He agrees to trial of CPAP at new settings He will keep sleep diary for upcoming visit with Dr. Maple Hudson  Follow up with Dr. Maple Hudson in 4 weeks or sooner if needed    OSA (obstructive sleep apnea) Patient Instructions  Continues to have issues with CPAP and insomnia  Insomnia: Will trial trazodone 50 mg 1-2 tablets at bedtime as needed for sleep Please  keep sleep diary Handout given on sleep hygiene  Sleep apnea recommendations: Will order CPAP desensitization and mask fitting at sleep lab  Change CPAP settings to AutoSet 5-15 cmH20  Note: patient refuses desensitization and mask fitting He does agree to trial of trazodone  He agrees to trial of CPAP at new settings He will keep sleep diary for upcoming visit with Dr. Maple Hudson  Follow up with Dr. Maple Hudson in 4 weeks or sooner if needed       Ivonne Andrew, NP 05/29/2018

## 2018-05-29 NOTE — Assessment & Plan Note (Addendum)
Patient Instructions  Continues to have issues with CPAP and insomnia  Insomnia: Will trial trazodone 50 mg 1-2 tablets at bedtime as needed for sleep Please keep sleep diary Handout given on sleep hygiene  Sleep apnea recommendations: Will order CPAP desensitization and mask fitting at sleep lab  Change CPAP settings to AutoSet 5-15 cmH20  Note: patient refuses desensitization and mask fitting He does agree to trial of trazodone  He agrees to trial of CPAP at new settings He will keep sleep diary for upcoming visit with Dr. Young  Follow up with Dr. Young in 4 weeks or sooner if needed   

## 2018-06-05 MED FILL — AMPICILLIN TR 500 MG CAP: 500 | 30 days supply | Qty: 30 | Fill #0

## 2018-07-07 ENCOUNTER — Other Ambulatory Visit: Payer: Self-pay | Admitting: Nurse Practitioner

## 2018-07-07 DIAGNOSIS — G4700 Insomnia, unspecified: Secondary | ICD-10-CM

## 2018-07-07 MED FILL — AMPICILLIN TR 500 MG CAP: 500 | 30 days supply | Qty: 30 | Fill #1

## 2018-07-07 MED FILL — traZODone HCL 50 MG TABS: 50 | 30 days supply | Qty: 30 | Fill #0

## 2018-07-07 NOTE — Telephone Encounter (Signed)
This patient was last seen on 05/29/18 for insomnia and OSA. Please advise if you are o.k with this prescription. Thanks.

## 2018-07-07 NOTE — Telephone Encounter (Signed)
It would need to be refilled by Dr. Maple Hudson.

## 2018-07-07 NOTE — Telephone Encounter (Signed)
Dr. Young - please advise on refill. Thanks. 

## 2018-08-28 MED FILL — AMPICILLIN TR 500 MG CAP: 500 | 30 days supply | Qty: 30 | Fill #2

## 2019-02-21 MED FILL — ADAPALENE 0.3% GEL: 0.3 | 30 days supply | Qty: 45 | Fill #1

## 2019-06-06 ENCOUNTER — Other Ambulatory Visit: Payer: Self-pay

## 2019-06-07 ENCOUNTER — Ambulatory Visit (INDEPENDENT_AMBULATORY_CARE_PROVIDER_SITE_OTHER): Payer: 59 | Admitting: Family Medicine

## 2019-06-07 ENCOUNTER — Encounter: Payer: Self-pay | Admitting: Family Medicine

## 2019-06-07 VITALS — BP 128/80 | HR 79 | Ht 75.0 in | Wt 220.0 lb

## 2019-06-07 DIAGNOSIS — Z0000 Encounter for general adult medical examination without abnormal findings: Secondary | ICD-10-CM

## 2019-06-07 NOTE — Progress Notes (Signed)
Established Patient Office Visit  Subjective:  Patient ID: Steven Shields, male    DOB: 1991-03-03  Age: 28 y.o. MRN: [id number]  CC:  Chief Complaint  Patient presents with  . Establish Care  . Annual Exam    HPI Steven Shields presents for establishment of care and a complete physical exam.  Patient is in good health as far as he knows.  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  He lives alone.  He has no chronic medical issues that he is aware of.  He had tested mildly positive for sleep apnea but was unable to tolerate the CPAP machine.  His parents are in their 22s.  Dad is in good health and has hypothyroidism.  Mom has been a type I diabetic since age 48.  She is been the recipient of the pancreas kidney transplant.  She has coronary artery disease.  She is otherwise doing well.  Patient lives alone.  He does not smoke but he does dip.  Reports a history of heavy drinking.  He is decreased the amount of alcohol use been drinking in the past.0,0,1,0 with [facility].  Chart review shows that he had a bout of pancreatitis in 2017.  He had a lesion on the underside of his tongue recently that resolved spontaneously.  He requests testing for STDs.  He has no symptoms of STDs.  Past Medical History:  Diagnosis Date  . Acne   . Pectus excavatum     History reviewed. No pertinent surgical history.  Family History  Problem Relation Age of Onset  . Diabetes Mother        type 1  . Diabetes Maternal Grandfather     Social History   Socioeconomic History  . Marital status: Single    Spouse name: Not on file  . Number of children: Not on file  . Years of education: Not on file  . Highest education level: Not on file  Occupational History  . Occupation: Scientist, water quality  . Financial resource strain: Not on file  . Food insecurity    Worry: Not on file    Inability: Not on file  . Transportation needs    Medical: Not on file    Non-medical: Not on  file  Tobacco Use  . Smoking status: Never Smoker  . Smokeless tobacco: Current User    Types: Chew  Substance and Sexual Activity  . Alcohol use: Yes    Alcohol/week: 0.0 standard drinks    Comment: occasionally  . Drug use: No  . Sexual activity: Not on file  Lifestyle  . Physical activity    Days per week: Not on file    Minutes per session: Not on file  . Stress: Not on file  Relationships  . Social Herbalist on phone: Not on file    Gets together: Not on file    Attends religious service: Not on file    Active member of club or organization: Not on file    Attends meetings of clubs or organizations: Not on file    Relationship status: Not on file  . Intimate partner violence    Fear of current or ex partner: Not on file    Emotionally abused: Not on file    Physically abused: Not on file    Forced sexual activity: Not on file  Other Topics Concern  . Not on file  Social History Narrative  . Not  on file    Outpatient Medications Prior to Visit  Medication Sig Dispense Refill  . Adapalene 0.3 % gel   3  . ampicillin (PRINCIPEN) 500 MG capsule   0  . traZODone (DESYREL) 50 MG tablet TAKE 1 TABLET (50 MG TOTAL) BY MOUTH AT BEDTIME. 30 tablet 5  . zaleplon (SONATA) 10 MG capsule Take 1 capsule (10 mg total) by mouth at bedtime as needed for sleep. (Patient not taking: Reported on 05/29/2018) 30 capsule 5   No facility-administered medications prior to visit.     No Known Allergies  ROS Review of Systems  Constitutional: Negative for chills, diaphoresis, fatigue, fever and unexpected weight change.  HENT: Negative.   Eyes: Negative for photophobia and visual disturbance.  Respiratory: Negative.   Cardiovascular: Negative.   Gastrointestinal: Negative.   Endocrine: Negative for polyphagia and polyuria.  Genitourinary: Negative for difficulty urinating, discharge, dysuria, frequency, genital sores, hematuria, penile pain and testicular pain.   Musculoskeletal: Negative for gait problem and joint swelling.  Skin: Negative for pallor and rash.  Allergic/Immunologic: Negative for immunocompromised state.  Neurological: Negative for light-headedness and headaches.  Hematological: Does not bruise/bleed easily.  Psychiatric/Behavioral: Negative.       Objective:    Physical Exam  Constitutional: He is oriented to person, place, and time. He appears well-developed and well-nourished. No distress.  HENT:  Head: Normocephalic and atraumatic.  Right Ear: External ear normal.  Left Ear: External ear normal.  Mouth/Throat: Oropharynx is clear and moist. No oropharyngeal exudate.    Eyes: Pupils are equal, round, and reactive to light. Conjunctivae are normal. Right eye exhibits no discharge. Left eye exhibits no discharge. No scleral icterus.  Neck: Neck supple. No JVD present. No tracheal deviation present. No thyromegaly present.  Cardiovascular: Normal rate, regular rhythm and normal heart sounds.  Pulmonary/Chest: Effort normal and breath sounds normal. No stridor.  Abdominal: Soft. Bowel sounds are normal. He exhibits no distension. There is no abdominal tenderness. There is no rebound and no guarding. Hernia confirmed negative in the right inguinal area and confirmed negative in the left inguinal area.  Genitourinary: Right testis shows no mass, no swelling and no tenderness. Right testis is descended. Left testis shows no mass, no swelling and no tenderness. Left testis is descended. Circumcised. No hypospadias, penile erythema or penile tenderness. No discharge found.  Musculoskeletal:        General: No edema.  Lymphadenopathy:    He has no cervical adenopathy.       Right: No inguinal adenopathy present.       Left: No inguinal adenopathy present.  Neurological: He is alert and oriented to person, place, and time.  Skin: Skin is warm and dry. No rash noted. He is not diaphoretic. No erythema.  Psychiatric: He has a normal  mood and affect. His behavior is normal.    BP 128/80   Pulse 79   Ht '6\' 3"'  (1.905 m)   Wt 220 lb (99.8 kg)   SpO2 97%   BMI 27.50 kg/m  Wt Readings from Last 3 Encounters:  06/07/19 220 lb (99.8 kg)  05/29/18 208 lb 9.6 oz (94.6 kg)  04/04/18 210 lb (95.3 kg)   BP Readings from Last 3 Encounters:  06/07/19 128/80  05/29/18 133/70  02/28/18 104/78   Guideline developer:  UpToDate (see UpToDate for funding source) Date Released: June 2014  Health Maintenance Due  Topic Date Due  . HIV Screening  03/16/2008  . INFLUENZA VACCINE  02/03/2019  There are no preventive care reminders to display for this patient.  Lab Results  Component Value Date   TSH 1.345 10/14/2011   Lab Results  Component Value Date   WBC 3.7 (A) 01/17/2016   HGB 14.7 01/17/2016   HCT 42.3 (A) 01/17/2016   MCV 85.5 01/17/2016   PLT 218 02/27/2015   Lab Results  Component Value Date   NA 138 01/17/2016   K 4.2 01/17/2016   CO2 28 01/17/2016   GLUCOSE 95 01/17/2016   BUN 25 01/17/2016   CREATININE 0.99 01/17/2016   BILITOT 1.0 01/17/2016   ALKPHOS 68 01/17/2016   AST 46 (H) 01/17/2016   ALT 37 01/17/2016   PROT 6.9 01/17/2016   ALBUMIN 4.4 01/17/2016   CALCIUM 9.3 01/17/2016   ANIONGAP 9 02/27/2015   No results found for: CHOL No results found for: HDL No results found for: LDLCALC No results found for: TRIG No results found for: CHOLHDL No results found for: HGBA1C    Assessment & Plan:   Problem List Items Addressed This Visit    None    Visit Diagnoses    Healthcare maintenance    -  Primary   Relevant Orders   CBC   Comp Met (CMET)   Lipid Profile   HIV antibody (with reflex)   TSH   Urinalysis, Routine w reflex microscopic   Urine cytology ancillary only([place])   RPR      No orders of the defined types were placed in this encounter.   Follow-up: Return in about 1 year (around 06/06/2020), or if symptoms worsen or fail to improve, for return fasting for  blood work. .   Patient was given information on health maintenance and disease prevention.  He will return fasting for above ordered blood work.  Assured him that I am here to help at any point if he would like to discuss his alcohol usage further.

## 2019-06-07 NOTE — Patient Instructions (Signed)
Preventive Care 19-26 Years Old, Male Preventive care refers to lifestyle choices and visits with your health care provider that can promote health and wellness. This includes:  A yearly physical exam. This is also called an annual well check.  Regular dental and eye exams.  Immunizations.  Screening for certain conditions.  Healthy lifestyle choices, such as eating a healthy diet, getting regular exercise, not using drugs or products that contain nicotine and tobacco, and limiting alcohol use. What can I expect for my preventive care visit? Physical exam Your health care provider will check:  Height and weight. These may be used to calculate body mass index (BMI), which is a measurement that tells if you are at a healthy weight.  Heart rate and blood pressure.  Your skin for abnormal spots. Counseling Your health care provider may ask you questions about:  Alcohol, tobacco, and drug use.  Emotional well-being.  Home and relationship well-being.  Sexual activity.  Eating habits.  Work and work Statistician. What immunizations do I need?  Influenza (flu) vaccine  This is recommended every year. Tetanus, diphtheria, and pertussis (Tdap) vaccine  You may need a Td booster every 10 years. Varicella (chickenpox) vaccine  You may need this vaccine if you have not already been vaccinated. Human papillomavirus (HPV) vaccine  If recommended by your health care provider, you may need three doses over 6 months. Measles, mumps, and rubella (MMR) vaccine  You may need at least one dose of MMR. You may also need a second dose. Meningococcal conjugate (MenACWY) vaccine  One dose is recommended if you are 45-76 years old and a Market researcher living in a residence hall, or if you have one of several medical conditions. You may also need additional booster doses. Pneumococcal conjugate (PCV13) vaccine  You may need this if you have certain conditions and were not  previously vaccinated. Pneumococcal polysaccharide (PPSV23) vaccine  You may need one or two doses if you smoke cigarettes or if you have certain conditions. Hepatitis A vaccine  You may need this if you have certain conditions or if you travel or work in places where you may be exposed to hepatitis A. Hepatitis B vaccine  You may need this if you have certain conditions or if you travel or work in places where you may be exposed to hepatitis B. Haemophilus influenzae type b (Hib) vaccine  You may need this if you have certain risk factors. You may receive vaccines as individual doses or as more than one vaccine together in one shot (combination vaccines). Talk with your health care provider about the risks and benefits of combination vaccines. What tests do I need? Blood tests  Lipid and cholesterol levels. These may be checked every 5 years starting at age 17.  Hepatitis C test.  Hepatitis B test. Screening   Diabetes screening. This is done by checking your blood sugar (glucose) after you have not eaten for a while (fasting).  Sexually transmitted disease (STD) testing. Talk with your health care provider about your test results, treatment options, and if necessary, the need for more tests. Follow these instructions at home: Eating and drinking   Eat a diet that includes fresh fruits and vegetables, whole grains, lean protein, and low-fat dairy products.  Take vitamin and mineral supplements as recommended by your health care provider.  Do not drink alcohol if your health care provider tells you not to drink.  If you drink alcohol: ? Limit how much you have to 0-2  drinks a day. ? Be aware of how much alcohol is in your drink. In the [place] one drink equals one 12 oz bottle of beer (355 mL), one 5 oz glass of wine (148 mL), or one 1 oz glass of hard liquor (44 mL). Lifestyle  Take daily care of your teeth and gums.  Stay active. Exercise for at least 30 minutes on 5 or  more days each week.  Do not use any products that contain nicotine or tobacco, such as cigarettes, e-cigarettes, and chewing tobacco. If you need help quitting, ask your health care provider.  If you are sexually active, practice safe sex. Use a condom or other form of protection to prevent STIs (sexually transmitted infections). What's next?  Go to your health care provider once a year for a well check visit.  Ask your health care provider how often you should have your eyes and teeth checked.  Stay up to date on all vaccines. This information is not intended to replace advice given to you by your health care provider. Make sure you discuss any questions you have with your health care provider. Document Released: 08/17/2001 Document Revised: 06/15/2018 Document Reviewed: 06/15/2018 Elsevier Patient Education  2020 Elsevier Inc.  Health Maintenance, Male Adopting a healthy lifestyle and getting preventive care are important in promoting health and wellness. Ask your health care provider about:  The right schedule for you to have regular tests and exams.  Things you can do on your own to prevent diseases and keep yourself healthy. What should I know about diet, weight, and exercise? Eat a healthy diet   Eat a diet that includes plenty of vegetables, fruits, low-fat dairy products, and lean protein.  Do not eat a lot of foods that are high in solid fats, added sugars, or sodium. Maintain a healthy weight Body mass index (BMI) is a measurement that can be used to identify possible weight problems. It estimates body fat based on height and weight. Your health care provider can help determine your BMI and help you achieve or maintain a healthy weight. Get regular exercise Get regular exercise. This is one of the most important things you can do for your health. Most adults should:  Exercise for at least 150 minutes each week. The exercise should increase your heart rate and make you  sweat (moderate-intensity exercise).  Do strengthening exercises at least twice a week. This is in addition to the moderate-intensity exercise.  Spend less time sitting. Even light physical activity can be beneficial. Watch cholesterol and blood lipids Have your blood tested for lipids and cholesterol at 26 years of age, then have this test every 5 years. You may need to have your cholesterol levels checked more often if:  Your lipid or cholesterol levels are high.  You are older than 26 years of age.  You are at high risk for heart disease. What should I know about cancer screening? Many types of cancers can be detected early and may often be prevented. Depending on your health history and family history, you may need to have cancer screening at various ages. This may include screening for:  Colorectal cancer.  Prostate cancer.  Skin cancer.  Lung cancer. What should I know about heart disease, diabetes, and high blood pressure? Blood pressure and heart disease  High blood pressure causes heart disease and increases the risk of stroke. This is more likely to develop in people who have high blood pressure readings, are of African descent, or are   overweight.  Talk with your health care provider about your target blood pressure readings.  Have your blood pressure checked: ? Every 3-5 years if you are 18-39 years of age. ? Every year if you are 40 years old or older.  If you are between the ages of 65 and 75 and are a current or former smoker, ask your health care provider if you should have a one-time screening for abdominal aortic aneurysm (AAA). Diabetes Have regular diabetes screenings. This checks your fasting blood sugar level. Have the screening done:  Once every three years after age 45 if you are at a normal weight and have a low risk for diabetes.  More often and at a younger age if you are overweight or have a high risk for diabetes. What should I know about  preventing infection? Hepatitis B If you have a higher risk for hepatitis B, you should be screened for this virus. Talk with your health care provider to find out if you are at risk for hepatitis B infection. Hepatitis C Blood testing is recommended for:  Everyone born from 1945 through 1965.  Anyone with known risk factors for hepatitis C. Sexually transmitted infections (STIs)  You should be screened each year for STIs, including gonorrhea and chlamydia, if: ? You are sexually active and are younger than 26 years of age. ? You are older than 26 years of age and your health care provider tells you that you are at risk for this type of infection. ? Your sexual activity has changed since you were last screened, and you are at increased risk for chlamydia or gonorrhea. Ask your health care provider if you are at risk.  Ask your health care provider about whether you are at high risk for HIV. Your health care provider may recommend a prescription medicine to help prevent HIV infection. If you choose to take medicine to prevent HIV, you should first get tested for HIV. You should then be tested every 3 months for as long as you are taking the medicine. Follow these instructions at home: Lifestyle  Do not use any products that contain nicotine or tobacco, such as cigarettes, e-cigarettes, and chewing tobacco. If you need help quitting, ask your health care provider.  Do not use street drugs.  Do not share needles.  Ask your health care provider for help if you need support or information about quitting drugs. Alcohol use  Do not drink alcohol if your health care provider tells you not to drink.  If you drink alcohol: ? Limit how much you have to 0-2 drinks a day. ? Be aware of how much alcohol is in your drink. In the [place] one drink equals one 12 oz bottle of beer (355 mL), one 5 oz glass of wine (148 mL), or one 1 oz glass of hard liquor (44 mL). General instructions  Schedule  regular health, dental, and eye exams.  Stay current with your vaccines.  Tell your health care provider if: ? You often feel depressed. ? You have ever been abused or do not feel safe at home. Summary  Adopting a healthy lifestyle and getting preventive care are important in promoting health and wellness.  Follow your health care provider's instructions about healthy diet, exercising, and getting tested or screened for diseases.  Follow your health care provider's instructions on monitoring your cholesterol and blood pressure. This information is not intended to replace advice given to you by your health care provider. Make sure you discuss   any questions you have with your health care provider. Document Released: 12/18/2007 Document Revised: 06/14/2018 Document Reviewed: 06/14/2018 Elsevier Patient Education  2020 Elsevier Inc.  

## 2019-06-08 ENCOUNTER — Other Ambulatory Visit (INDEPENDENT_AMBULATORY_CARE_PROVIDER_SITE_OTHER): Payer: 59

## 2019-06-08 ENCOUNTER — Other Ambulatory Visit (HOSPITAL_COMMUNITY)
Admission: RE | Admit: 2019-06-08 | Discharge: 2019-06-08 | Disposition: A | Discharge status: Home / Self Care | Payer: 59 | Source: Ambulatory Visit | Attending: Family Medicine | Admitting: Family Medicine

## 2019-06-08 ENCOUNTER — Other Ambulatory Visit: Payer: Self-pay

## 2019-06-08 DIAGNOSIS — Z0000 Encounter for general adult medical examination without abnormal findings: Secondary | ICD-10-CM

## 2019-06-08 LAB — URINALYSIS, ROUTINE W REFLEX MICROSCOPIC
Bilirubin Urine: NEGATIVE
Hgb urine dipstick: NEGATIVE
Ketones, ur: NEGATIVE
Leukocytes,Ua: NEGATIVE
Nitrite: NEGATIVE
RBC / HPF: NONE SEEN (ref 0–?)
Specific Gravity, Urine: 1.02 (ref 1.000–1.030)
Total Protein, Urine: NEGATIVE
Urine Glucose: NEGATIVE
Urobilinogen, UA: 0.2 (ref 0.0–1.0)
WBC, UA: NONE SEEN (ref 0–?)
pH: 7.5 (ref 5.0–8.0)

## 2019-06-08 LAB — COMPREHENSIVE METABOLIC PANEL
ALT: 30 U/L (ref 0–53)
AST: 35 U/L (ref 0–37)
Albumin: 4.8 g/dL (ref 3.5–5.2)
Alkaline Phosphatase: 57 U/L (ref 39–117)
BUN: 24 mg/dL — ABNORMAL HIGH (ref 6–23)
CO2: 29 mEq/L (ref 19–32)
Calcium: 10 mg/dL (ref 8.4–10.5)
Chloride: 99 mEq/L (ref 96–112)
Creatinine, Ser: 1.02 mg/dL (ref 0.40–1.50)
GFR: 88.13 mL/min (ref >60.00)
Glucose, Bld: 93 mg/dL (ref 70–99)
Potassium: 4.3 mEq/L (ref 3.5–5.1)
Sodium: 137 mEq/L (ref 135–145)
Total Bilirubin: 2 mg/dL — ABNORMAL HIGH (ref 0.2–1.2)
Total Protein: 7.7 g/dL (ref 6.0–8.3)

## 2019-06-08 LAB — LIPID PANEL
Cholesterol: 225 mg/dL — ABNORMAL HIGH (ref 0–200)
HDL: 78.3 mg/dL (ref >39.00)
LDL Cholesterol: 133 mg/dL — ABNORMAL HIGH (ref 0–99)
NonHDL: 147.14
Total CHOL/HDL Ratio: 3
Triglycerides: 72 mg/dL (ref 0.0–149.0)
VLDL: 14.4 mg/dL (ref 0.0–40.0)

## 2019-06-08 LAB — CBC
HCT: 48.2 % (ref 39.0–52.0)
Hemoglobin: 16.2 g/dL (ref 13.0–17.0)
MCHC: 33.6 g/dL (ref 30.0–36.0)
MCV: 89 fl (ref 78.0–100.0)
Platelets: 230 10*3/uL (ref 150.0–400.0)
RBC: 5.41 Mil/uL (ref 4.22–5.81)
RDW: 12.2 % (ref 11.5–15.5)
WBC: 4.3 10*3/uL (ref 4.0–10.5)

## 2019-06-08 LAB — TSH: TSH: 1.97 u[IU]/mL (ref 0.35–4.50)

## 2019-06-11 LAB — HIV ANTIBODY (ROUTINE TESTING W REFLEX): HIV 1&2 Ab, 4th Generation: NONREACTIVE

## 2019-06-11 LAB — URINE CYTOLOGY ANCILLARY ONLY
Chlamydia: NEGATIVE
Comment: NEGATIVE
Comment: NORMAL
Neisseria Gonorrhea: NEGATIVE

## 2019-06-11 LAB — RPR: RPR Ser Ql: NONREACTIVE

## 2020-05-19 NOTE — Progress Notes (Signed)
I, Steven Shields, LAT, ATC acting as a scribe for Steven Graham, MD.  Subjective:    CC: R knee pn  HPI: Pt c/o pn along the anterior to medial and lateral aspect of R knee. Pn has been going on for the past 3 weeks, w/ no known mechanism. Woke up one morning and noticed knee hurting. Difficulty squatting and driving (moving foot from gas to break). Rx tried: none. No pn w/ walking.  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  He does not recall any injury.  He denies any significant change in activity prior to his pain.  Pain is bothersome enough that it interferes with his ability to exercise and starting to interfere with ability to work safely as a IT sales professional.  Pertinent review of Systems: No fevers or chills  Relevant historical information: Chronically elevated bilirubin   Objective:    Vitals:   05/20/20 0819  BP: (!) 152/88  Pulse: 66  SpO2: 99%   General: Well Developed, well nourished, and in no acute distress.   MSK: Right knee normal-appearing no effusion or swelling. Normal motion without crepitation. Minimally tender overlying patella and quad tendon. Intact strength some pain with resisted knee extension. Stable ligamentous exam. Negative McMurray's test.  Lab and Radiology Results  Diagnostic Limited MSK Ultrasound of: Right knee Quad tendon intact normal-appearing Small effusion present in superior patellar space.  However appearance of effusion on ultrasound is speckled appearing with areas of hyperechoic change.  This is sometimes consistent for gout effusion or other crystal arthropathies. Patellar tendon intact normal. Superficial patellar tendon small hypoechoic fluid tracking along subcutaneous fat consistent with prepatellar bursitis mildly. Medial and lateral joint lines are normal. Posterior knee no Baker's cyst. Impression: Small joint effusion possible gout.   Prepatellar bursitis   X-ray images right knee obtained today personally and independently interpreted No significant arthritis.  No acute fractures.  Normal-appearing x-ray Await formal radiology review     Chemistry      Component Value Date/Time   NA 137 06/08/2019 1023   K 4.3 06/08/2019 1023   CL 99 06/08/2019 1023   CO2 29 06/08/2019 1023   BUN 24 (H) 06/08/2019 1023   CREATININE 1.02 06/08/2019 1023   CREATININE 0.99 01/17/2016 1156      Component Value Date/Time   CALCIUM 10.0 06/08/2019 1023   ALKPHOS 57 06/08/2019 1023   AST 35 06/08/2019 1023   ALT 30 06/08/2019 1023   BILITOT 2.0 (H) 06/08/2019 1023       Impression and Recommendations:    Assessment and Plan: 29 y.o. male with right knee pain ongoing for about 3 weeks.  Unclear etiology.  Patient does have some changes on physical exam and ultrasound examination to suggest possible gout and does have evidence of prepatellar bursitis.  Plan to work-up knee with uric acid assessment to assess for gout.  Also check metabolic panel as kidney dosing may be important for uric acid medication also will check bilirubin at that time given history of elevated bilirubin.  Marland Kitchen  PDMP not reviewed this encounter. Orders Placed This Encounter  Procedures  . [place] LIMITED JOINT SPACE STRUCTURES LOW RIGHT(NO LINKED CHARGES)    Standing Status:   Future    Number of Occurrences:   1    Standing Expiration Date:   11/17/2020    Order Specific Question:   Reason for Exam (SYMPTOM  OR DIAGNOSIS REQUIRED)  Answer:   Right knee pain    Order Specific Question:   Preferred imaging location?    Answer:   [facility]  . DG Knee AP/LAT W/Sunrise Right    Standing Status:   Future    Number of Occurrences:   1    Standing Expiration Date:   05/20/2021    Order Specific Question:   Reason for Exam (SYMPTOM  OR DIAGNOSIS REQUIRED)    Answer:   eval knee pain    Order Specific Question:   Preferred imaging  location?    Answer:   [facility]  . Uric acid    Standing Status:   Future    Number of Occurrences:   1    Standing Expiration Date:   05/20/2021  . Comprehensive metabolic panel    Standing Status:   Future    Number of Occurrences:   1    Standing Expiration Date:   05/20/2021   No orders of the defined types were placed in this encounter.   Discussed warning signs or symptoms. Please see discharge instructions. Patient expresses understanding.   The above documentation has been reviewed and is accurate and complete Steven Shields, M.D.

## 2020-05-20 ENCOUNTER — Other Ambulatory Visit: Payer: Self-pay

## 2020-05-20 ENCOUNTER — Ambulatory Visit: Payer: 59 | Admitting: Family Medicine

## 2020-05-20 ENCOUNTER — Ambulatory Visit (INDEPENDENT_AMBULATORY_CARE_PROVIDER_SITE_OTHER): Payer: 59

## 2020-05-20 ENCOUNTER — Ambulatory Visit: Payer: Self-pay

## 2020-05-20 VITALS — BP 152/88 | HR 66 | Ht 75.0 in | Wt 225.6 lb

## 2020-05-20 DIAGNOSIS — R17 Unspecified jaundice: Secondary | ICD-10-CM

## 2020-05-20 DIAGNOSIS — M25561 Pain in right knee: Secondary | ICD-10-CM | POA: Diagnosis not present

## 2020-05-20 LAB — COMPREHENSIVE METABOLIC PANEL
ALT: 32 U/L (ref 0–53)
AST: 43 U/L — ABNORMAL HIGH (ref 0–37)
Albumin: 4.7 g/dL (ref 3.5–5.2)
Alkaline Phosphatase: 50 U/L (ref 39–117)
BUN: 20 mg/dL (ref 6–23)
CO2: 31 mEq/L (ref 19–32)
Calcium: 10 mg/dL (ref 8.4–10.5)
Chloride: 99 mEq/L (ref 96–112)
Creatinine, Ser: 1.13 mg/dL (ref 0.40–1.50)
GFR: 89.28 mL/min (ref >60.00)
Glucose, Bld: 91 mg/dL (ref 70–99)
Potassium: 4.4 mEq/L (ref 3.5–5.1)
Sodium: 135 mEq/L (ref 135–145)
Total Bilirubin: 1.6 mg/dL — ABNORMAL HIGH (ref 0.2–1.2)
Total Protein: 8 g/dL (ref 6.0–8.3)

## 2020-05-20 LAB — URIC ACID: Uric Acid, Serum: 5.1 mg/dL (ref 4.0–7.8)

## 2020-05-20 NOTE — Patient Instructions (Signed)
Thank you for coming in today.  Please get an Xray today before you leave  Please get labs today before you leave  Please use voltaren gel up to 4x daily for pain as needed.   I recommend you obtained a compression sleeve to help with your joint problems. There are many options on the market however I recommend obtaining a full knee Body Helix compression sleeve.  You can find information (including how to appropriate measure yourself for sizing) can be found at [email or web address].ch.  Many of these products are health savings account (HSA) eligible.   You can use the compression sleeve at any time throughout the day but is most important to use while being active as well as for 2 hours post-activity.   It is appropriate to ice following activity with the compression sleeve in place.  If not better in a few weeks let me know. Next step is either MRI or injection.   Exercise avoid activity that makes you limp.   Negative single leg squat to 70 deg knee flexion.  Do it with you heel on a block.  30 reps 2-3x daily.    Prepatellar Bursitis Rehab Ask your health care provider which exercises are safe for you. Do exercises exactly as told by your health care provider and adjust them as directed. It is normal to feel mild stretching, pulling, tightness, or discomfort as you do these exercises. Stop right away if you feel sudden pain or your pain gets worse. Do not begin these exercises until told by your health care provider. Stretching and range-of-motion exercises These exercises warm up your muscles and joints and improve the movement and flexibility of your knee. These exercises also help to relieve pain, numbness, and tingling. Hamstring stretch, standing This is an exercise in which you prop your leg on a chair and lean forward to achieve a stretch. To do this exercise: 1. Stand with your left / right heel resting on a chair. Your left / right leg should be fully extended. 2. Arch your lower  back slightly. 3. Lean forward at the waist, leading with your chest, until you feel a gentle stretch in the back of your left / right knee or thigh. You should not need to lean far to feel the stretch. 4. Hold this position for __________ seconds. Repeat __________ times. Complete this exercise __________ times a day. Knee flexion, active  1. Lie on your back with both knees straight. If this causes back discomfort, bend your healthy knee so your foot is flat on the floor. 2. Slowly slide your left / right heel back toward your buttocks (knee flexion). Stop when you feel a gentle stretch in the front of your knee or thigh. 3. Hold this position for __________ seconds. 4. Slowly slide your left / right heel back to the starting position. Repeat __________ times. Complete this exercise __________ times a day. Strengthening exercises These exercises build strength and endurance in your knee. Endurance is the ability to use your muscles for a long time, even after they get tired. Quadriceps, isometric This exercise stretches the muscles in the front of your thigh (quadriceps) without moving your knee joint (isometric). 1. Lie on your back with your left / right leg extended and your other knee bent. If told by your health care provider, put a rolled towel or a small pillow under your left / right knee. 2. Slowly tense the muscles in the front of your left / right thigh. You  should see your kneecap slide up toward your hip or see increased dimpling just above your knee. This motion will push the back of your knee down toward the surface that is under it. 3. For __________ seconds, hold the muscles as tight as you can without increasing your pain. 4. Relax the muscles slowly and completely after each repetition. Repeat __________ times. Complete this exercise __________ times a day. Straight leg raises, supine This exercise is sometimes called a quadriceps and hip flexor exercise. 1. Lie on your  back (supine position) with your left / right leg extended and your healthy knee bent. 2. Slowly tense the muscles in the front of your left / right thigh (quadriceps). You should see your kneecap slide up or see increased dimpling just above the knee. 3. Tighten these muscles even more as you raise your leg 4-6 inches (10-15 cm) off the floor. Do not let your knee bend. 4. Hold this position for __________ seconds. 5. Keep the thigh muscles tense as you lower your leg. 6. Relax your muscles slowly and completely after each repetition. Repeat __________ times. Complete this exercise __________ times a day. Straight leg raises, prone This is an exercise in which you stretch the muscles in the front and back of your thigh (hip extensors). 1. Lie on your abdomen (prone position) on a firm surface. You can put a pillow under your hips if that is more comfortable for your lower back. 2. Squeeze your buttocks muscles and lift your left / right leg about 4-6 inches (10-15 cm). Keep your knee straight as you lift your leg. 3. Hold this position for __________ seconds. 4. Slowly lower your leg to the starting position. 5. Let your muscles relax completely after each repetition. Repeat __________ times. Complete this exercise __________ times a day. This information is not intended to replace advice given to you by your health care provider. Make sure you discuss any questions you have with your health care provider. Document Revised: 10/16/2018 Document Reviewed: 10/16/2018 Elsevier Patient Education  2020 [employer].

## 2020-05-20 NOTE — Progress Notes (Signed)
Labs do not show elevated uric acid.  Doubtful for gout.  Kidney function is normal.  Bilirubin is stable from prior

## 2020-05-20 NOTE — Progress Notes (Signed)
Knee x-ray looks normal.

## 2020-06-10 ENCOUNTER — Encounter: Payer: 59 | Admitting: Family Medicine

## 2020-07-16 ENCOUNTER — Encounter: Payer: 59 | Admitting: Family Medicine

## 2020-09-02 ENCOUNTER — Encounter: Payer: 59 | Admitting: Family Medicine

## 2020-10-20 ENCOUNTER — Other Ambulatory Visit: Payer: Self-pay

## 2020-10-21 ENCOUNTER — Encounter: Payer: Self-pay | Admitting: Family Medicine

## 2020-10-21 ENCOUNTER — Other Ambulatory Visit (HOSPITAL_COMMUNITY)
Admission: RE | Admit: 2020-10-21 | Discharge: 2020-10-21 | Disposition: A | Discharge status: Home / Self Care | Payer: 59 | Source: Ambulatory Visit | Attending: Family Medicine | Admitting: Family Medicine

## 2020-10-21 ENCOUNTER — Ambulatory Visit (INDEPENDENT_AMBULATORY_CARE_PROVIDER_SITE_OTHER): Payer: 59 | Admitting: Family Medicine

## 2020-10-21 VITALS — BP 144/82 | HR 76 | Temp 97.4°F | Ht 75.0 in | Wt 223.0 lb

## 2020-10-21 DIAGNOSIS — Z0000 Encounter for general adult medical examination without abnormal findings: Secondary | ICD-10-CM | POA: Insufficient documentation

## 2020-10-21 LAB — URINALYSIS, ROUTINE W REFLEX MICROSCOPIC
Bilirubin Urine: NEGATIVE
Hgb urine dipstick: NEGATIVE
Ketones, ur: NEGATIVE
Leukocytes,Ua: NEGATIVE
Nitrite: NEGATIVE
RBC / HPF: NONE SEEN (ref 0–?)
Specific Gravity, Urine: 1.015 (ref 1.000–1.030)
Total Protein, Urine: NEGATIVE
Urine Glucose: NEGATIVE
Urobilinogen, UA: 1 (ref 0.0–1.0)
WBC, UA: NONE SEEN (ref 0–?)
pH: 6 (ref 5.0–8.0)

## 2020-10-21 NOTE — Patient Instructions (Signed)
Preventive Care 21-28 Years Old, Male Preventive care refers to lifestyle choices and visits with your health care provider that can promote health and wellness. This includes:  A yearly physical exam. This is also called an annual wellness visit.  Regular dental and eye exams.  Immunizations.  Screening for certain conditions.  Healthy lifestyle choices, such as: ? Eating a healthy diet. ? Getting regular exercise. ? Not using drugs or products that contain nicotine and tobacco. ? Limiting alcohol use. What can I expect for my preventive care visit? Physical exam Your health care provider may check your:  Height and weight. These may be used to calculate your BMI (body mass index). BMI is a measurement that tells if you are at a healthy weight.  Heart rate and blood pressure.  Body temperature.  Skin for abnormal spots. Counseling Your health care provider may ask you questions about your:  Past medical problems.  Family's medical history.  Alcohol, tobacco, and drug use.  Emotional well-being.  Home life and relationship well-being.  Sexual activity.  Diet, exercise, and sleep habits.  Work and work environment.  Access to firearms. What immunizations do I need? Vaccines are usually given at various ages, according to a schedule. Your health care provider will recommend vaccines for you based on your age, medical history, and lifestyle or other factors, such as travel or where you work.   What tests do I need? Blood tests  Lipid and cholesterol levels. These may be checked every 5 years starting at age 20.  Hepatitis C test.  Hepatitis B test. Screening  Diabetes screening. This is done by checking your blood sugar (glucose) after you have not eaten for a while (fasting).  Genital exam to check for testicular cancer or hernias.  STD (sexually transmitted disease) testing, if you are at risk. Talk with your health care provider about your test results,  treatment options, and if necessary, the need for more tests.   Follow these instructions at home: Eating and drinking  Eat a healthy diet that includes fresh fruits and vegetables, whole grains, lean protein, and low-fat dairy products.  Drink enough fluid to keep your urine pale yellow.  Take vitamin and mineral supplements as recommended by your health care provider.  Do not drink alcohol if your health care provider tells you not to drink.  If you drink alcohol: ? Limit how much you have to 0-2 drinks a day. ? Be aware of how much alcohol is in your drink. In the [place] one drink equals one 12 oz bottle of beer (355 mL), one 5 oz glass of wine (148 mL), or one 1 oz glass of hard liquor (44 mL).   Lifestyle  Take daily care of your teeth and gums. Brush your teeth every morning and night with fluoride toothpaste. Floss one time each day.  Stay active. Exercise for at least 30 minutes 5 or more days each week.  Do not use any products that contain nicotine or tobacco, such as cigarettes, e-cigarettes, and chewing tobacco. If you need help quitting, ask your health care provider.  Do not use drugs.  If you are sexually active, practice safe sex. Use a condom or other form of protection to prevent STIs (sexually transmitted infections).  Find healthy ways to cope with stress, such as: ? Meditation, yoga, or listening to music. ? Journaling. ? Talking to a trusted person. ? Spending time with friends and family. Safety  Always wear your seat belt while driving   or riding in a vehicle.  Do not drive: ? If you have been drinking alcohol. Do not ride with someone who has been drinking. ? When you are tired or distracted. ? While texting.  Wear a helmet and other protective equipment during sports activities.  If you have firearms in your house, make sure you follow all gun safety procedures.  Seek help if you have been physically or sexually abused. What's next?  Go to your  health care provider once a year for an annual wellness visit.  Ask your health care provider how often you should have your eyes and teeth checked.  Stay up to date on all vaccines. This information is not intended to replace advice given to you by your health care provider. Make sure you discuss any questions you have with your health care provider. Document Revised: 03/07/2019 Document Reviewed: 06/15/2018 Elsevier Patient Education  2021 Elsevier Inc.  Health Maintenance, Male Adopting a healthy lifestyle and getting preventive care are important in promoting health and wellness. Ask your health care provider about:  The right schedule for you to have regular tests and exams.  Things you can do on your own to prevent diseases and keep yourself healthy. What should I know about diet, weight, and exercise? Eat a healthy diet  Eat a diet that includes plenty of vegetables, fruits, low-fat dairy products, and lean protein.  Do not eat a lot of foods that are high in solid fats, added sugars, or sodium.   Maintain a healthy weight Body mass index (BMI) is a measurement that can be used to identify possible weight problems. It estimates body fat based on height and weight. Your health care provider can help determine your BMI and help you achieve or maintain a healthy weight. Get regular exercise Get regular exercise. This is one of the most important things you can do for your health. Most adults should:  Exercise for at least 150 minutes each week. The exercise should increase your heart rate and make you sweat (moderate-intensity exercise).  Do strengthening exercises at least twice a week. This is in addition to the moderate-intensity exercise.  Spend less time sitting. Even light physical activity can be beneficial. Watch cholesterol and blood lipids Have your blood tested for lipids and cholesterol at 28 years of age, then have this test every 5 years. You may need to have your  cholesterol levels checked more often if:  Your lipid or cholesterol levels are high.  You are older than 28 years of age.  You are at high risk for heart disease. What should I know about cancer screening? Many types of cancers can be detected early and may often be prevented. Depending on your health history and family history, you may need to have cancer screening at various ages. This may include screening for:  Colorectal cancer.  Prostate cancer.  Skin cancer.  Lung cancer. What should I know about heart disease, diabetes, and high blood pressure? Blood pressure and heart disease  High blood pressure causes heart disease and increases the risk of stroke. This is more likely to develop in people who have high blood pressure readings, are of African descent, or are overweight.  Talk with your health care provider about your target blood pressure readings.  Have your blood pressure checked: ? Every 3-5 years if you are 18-28 years of age. ? Every year if you are 40 years old or older.  If you are between the ages of 65 and   75 and are a current or former smoker, ask your health care provider if you should have a one-time screening for abdominal aortic aneurysm (AAA). Diabetes Have regular diabetes screenings. This checks your fasting blood sugar level. Have the screening done:  Once every three years after age 45 if you are at a normal weight and have a low risk for diabetes.  More often and at a younger age if you are overweight or have a high risk for diabetes. What should I know about preventing infection? Hepatitis B If you have a higher risk for hepatitis B, you should be screened for this virus. Talk with your health care provider to find out if you are at risk for hepatitis B infection. Hepatitis C Blood testing is recommended for:  Everyone born from 1945 through 1965.  Anyone with known risk factors for hepatitis C. Sexually transmitted infections (STIs)  You  should be screened each year for STIs, including gonorrhea and chlamydia, if: ? You are sexually active and are younger than 28 years of age. ? You are older than 28 years of age and your health care provider tells you that you are at risk for this type of infection. ? Your sexual activity has changed since you were last screened, and you are at increased risk for chlamydia or gonorrhea. Ask your health care provider if you are at risk.  Ask your health care provider about whether you are at high risk for HIV. Your health care provider may recommend a prescription medicine to help prevent HIV infection. If you choose to take medicine to prevent HIV, you should first get tested for HIV. You should then be tested every 3 months for as long as you are taking the medicine. Follow these instructions at home: Lifestyle  Do not use any products that contain nicotine or tobacco, such as cigarettes, e-cigarettes, and chewing tobacco. If you need help quitting, ask your health care provider.  Do not use street drugs.  Do not share needles.  Ask your health care provider for help if you need support or information about quitting drugs. Alcohol use  Do not drink alcohol if your health care provider tells you not to drink.  If you drink alcohol: ? Limit how much you have to 0-2 drinks a day. ? Be aware of how much alcohol is in your drink. In the [place] one drink equals one 12 oz bottle of beer (355 mL), one 5 oz glass of wine (148 mL), or one 1 oz glass of hard liquor (44 mL). General instructions  Schedule regular health, dental, and eye exams.  Stay current with your vaccines.  Tell your health care provider if: ? You often feel depressed. ? You have ever been abused or do not feel safe at home. Summary  Adopting a healthy lifestyle and getting preventive care are important in promoting health and wellness.  Follow your health care provider's instructions about healthy diet, exercising, and  getting tested or screened for diseases.  Follow your health care provider's instructions on monitoring your cholesterol and blood pressure. This information is not intended to replace advice given to you by your health care provider. Make sure you discuss any questions you have with your health care provider. Document Revised: 06/14/2018 Document Reviewed: 06/14/2018 Elsevier Patient Education  2021 Elsevier Inc.  

## 2020-10-21 NOTE — Progress Notes (Signed)
Established Patient Office Visit  Subjective:  Patient ID: Steven Shields, male    DOB: 1991/03/26  Age: 30 y.o. MRN: [id number]  CC:  Chief Complaint  Patient presents with  . Annual Exam    CPE, no concerns. Patient not fasting.     HPI Steven Shields presents for yearly physical.  He has been doing well.  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  He lives alone.  Mom continues to do well status post pancreatic transplant.  He has a couple of beers twice a week and stops.  Denies that he drinks more than he intends.  He does chew tobacco but does not smoke.  Past Medical History:  Diagnosis Date  . Acne   . Pectus excavatum     History reviewed. No pertinent surgical history.  Family History  Problem Relation Age of Onset  . Diabetes Mother        type 1  . Diabetes Maternal Grandfather     Social History   Socioeconomic History  . Marital status: Single    Spouse name: Not on file  . Number of children: Not on file  . Years of education: Not on file  . Highest education level: Not on file  Occupational History  . Occupation: firefighter  Tobacco Use  . Smoking status: Never Smoker  . Smokeless tobacco: Current User    Types: Chew  Vaping Use  . Vaping Use: Never used  Substance and Sexual Activity  . Alcohol use: Yes    Alcohol/week: 0.0 standard drinks    Comment: occasionally  . Drug use: No  . Sexual activity: Not Currently  Other Topics Concern  . Not on file  Social History Narrative  . Not on file   Social Determinants of Health   Financial Resource Strain: Not on file  Food Insecurity: Not on file  Transportation Needs: Not on file  Physical Activity: Not on file  Stress: Not on file  Social Connections: Not on file  Intimate Partner Violence: Not on file    No outpatient medications prior to visit.   No facility-administered medications prior to visit.    No Known Allergies  ROS Review of Systems  Constitutional: Negative.    HENT: Negative.   Eyes: Negative for photophobia and visual disturbance.  Respiratory: Negative.   Cardiovascular: Negative.   Gastrointestinal: Negative.   Endocrine: Negative for polyphagia and polyuria.  Genitourinary: Negative for difficulty urinating, dysuria and penile discharge.  Musculoskeletal: Negative for gait problem and joint swelling.  Skin: Negative for pallor and rash.  Allergic/Immunologic: Negative for immunocompromised state.  Neurological: Negative for speech difficulty and weakness.  Hematological: Does not bruise/bleed easily.  Psychiatric/Behavioral: Negative.       Objective:    Physical Exam Vitals and nursing note reviewed.  Constitutional:      General: He is not in acute distress.    Appearance: Normal appearance. He is normal weight. He is not ill-appearing, toxic-appearing or diaphoretic.  HENT:     Head: Normocephalic and atraumatic.     Right Ear: Tympanic membrane, ear canal and external ear normal.     Left Ear: Tympanic membrane, ear canal and external ear normal.     Mouth/Throat:     Mouth: Mucous membranes are moist.     Pharynx: Oropharynx is clear. No oropharyngeal exudate or posterior oropharyngeal erythema.  Eyes:     General: No scleral icterus.       Right eye: No discharge.  Left eye: No discharge.     Extraocular Movements: Extraocular movements intact.     Conjunctiva/sclera: Conjunctivae normal.     Pupils: Pupils are equal, round, and reactive to light.  Cardiovascular:     Rate and Rhythm: Normal rate and regular rhythm.  Pulmonary:     Effort: Pulmonary effort is normal.     Breath sounds: Normal breath sounds.  Abdominal:     General: Abdomen is flat. Bowel sounds are normal. There is no distension.     Palpations: There is no mass.     Tenderness: There is no abdominal tenderness. There is no guarding or rebound.     Hernia: No hernia is present. There is no hernia in the left inguinal area or right inguinal  area.  Genitourinary:    Penis: Circumcised. No hypospadias, erythema, tenderness, discharge, swelling or lesions.      Testes:        Right: Mass, tenderness or swelling not present. Right testis is descended.        Left: Mass, tenderness or swelling not present. Left testis is descended.     Epididymis:     Right: Not inflamed.     Left: Not inflamed.  Musculoskeletal:     Cervical back: No rigidity or tenderness.  Lymphadenopathy:     Cervical: No cervical adenopathy.     Lower Body: No right inguinal adenopathy. No left inguinal adenopathy.  Skin:    General: Skin is warm and dry.  Neurological:     Mental Status: He is alert and oriented to person, place, and time.  Psychiatric:        Mood and Affect: Mood normal.        Behavior: Behavior normal.     BP (!) 144/82   Pulse 76   Temp (!) 97.4 F (36.3 C) (Temporal)   Ht 6\' 3"  (1.905 m)   Wt 223 lb (101.2 kg)   SpO2 99%   BMI 27.87 kg/m  Wt Readings from Last 3 Encounters:  10/21/20 223 lb (101.2 kg)  05/20/20 225 lb 9.6 oz (102.3 kg)  06/07/19 220 lb (99.8 kg)     Health Maintenance Due  Topic Date Due  . Hepatitis C Screening  Never done    There are no preventive care reminders to display for this patient.  Lab Results  Component Value Date   TSH 1.97 06/08/2019   Lab Results  Component Value Date   WBC 4.3 06/08/2019   HGB 16.2 06/08/2019   HCT 48.2 06/08/2019   MCV 89.0 06/08/2019   PLT 230.0 06/08/2019   Lab Results  Component Value Date   NA 135 05/20/2020   K 4.4 05/20/2020   CO2 31 05/20/2020   GLUCOSE 91 05/20/2020   BUN 20 05/20/2020   CREATININE 1.13 05/20/2020   BILITOT 1.6 (H) 05/20/2020   ALKPHOS 50 05/20/2020   AST 43 (H) 05/20/2020   ALT 32 05/20/2020   PROT 8.0 05/20/2020   ALBUMIN 4.7 05/20/2020   CALCIUM 10.0 05/20/2020   ANIONGAP 9 02/27/2015   GFR 89.28 05/20/2020   Lab Results  Component Value Date   CHOL 225 (H) 06/08/2019   Lab Results  Component Value  Date   HDL 78.30 06/08/2019   Lab Results  Component Value Date   LDLCALC 133 (H) 06/08/2019   Lab Results  Component Value Date   TRIG 72.0 06/08/2019   Lab Results  Component Value Date   CHOLHDL 3 06/08/2019  No results found for: HGBA1C    Assessment & Plan:   Problem List Items Addressed This Visit      Other   Healthcare maintenance - Primary   Relevant Orders   Comprehensive metabolic panel   CBC   HIV Antibody (routine testing w rflx)   RPR   Urinalysis, Routine w reflex microscopic   Urine cytology ancillary only      No orders of the defined types were placed in this encounter.   Follow-up: Return in about 1 year (around 10/21/2021), or if symptoms worsen or fail to improve.  Patient was given information on health maintenance and disease prevention.  I asked him to stop chewing tobacco.  Advised him to be careful with alcohol.  He is aware that he has increased cancer risks with his occupation.   Mliss Sax, MD

## 2020-10-22 LAB — COMPREHENSIVE METABOLIC PANEL
ALT: 33 U/L (ref 0–53)
AST: 44 U/L — ABNORMAL HIGH (ref 0–37)
Albumin: 4.7 g/dL (ref 3.5–5.2)
Alkaline Phosphatase: 57 U/L (ref 39–117)
BUN: 30 mg/dL — ABNORMAL HIGH (ref 6–23)
CO2: 27 mEq/L (ref 19–32)
Calcium: 10 mg/dL (ref 8.4–10.5)
Chloride: 99 mEq/L (ref 96–112)
Creatinine, Ser: 1.54 mg/dL — ABNORMAL HIGH (ref 0.40–1.50)
GFR: 61.4 mL/min (ref >60.00)
Glucose, Bld: 74 mg/dL (ref 70–99)
Potassium: 3.9 mEq/L (ref 3.5–5.1)
Sodium: 137 mEq/L (ref 135–145)
Total Bilirubin: 1.5 mg/dL — ABNORMAL HIGH (ref 0.2–1.2)
Total Protein: 7.8 g/dL (ref 6.0–8.3)

## 2020-10-22 LAB — CBC
HCT: 47.2 % (ref 39.0–52.0)
Hemoglobin: 15.8 g/dL (ref 13.0–17.0)
MCHC: 33.4 g/dL (ref 30.0–36.0)
MCV: 89.4 fl (ref 78.0–100.0)
Platelets: 244 10*3/uL (ref 150.0–400.0)
RBC: 5.29 Mil/uL (ref 4.22–5.81)
RDW: 12.3 % (ref 11.5–15.5)
WBC: 5.7 10*3/uL (ref 4.0–10.5)

## 2020-10-22 LAB — URINE CYTOLOGY ANCILLARY ONLY
Chlamydia: NEGATIVE
Comment: NEGATIVE
Comment: NORMAL
Neisseria Gonorrhea: NEGATIVE

## 2020-10-22 LAB — HIV ANTIBODY (ROUTINE TESTING W REFLEX): HIV 1&2 Ab, 4th Generation: NONREACTIVE

## 2020-10-22 LAB — RPR: RPR Ser Ql: NONREACTIVE

## 2020-11-25 ENCOUNTER — Other Ambulatory Visit (HOSPITAL_COMMUNITY): Payer: Self-pay

## 2020-11-25 MED ORDER — TAZAROTENE 0.1 % EX CREA
TOPICAL_CREAM | CUTANEOUS | 2 refills | Status: DC
  Filled 2020-11-25: qty 30, 30d supply, fill #0

## 2020-11-26 ENCOUNTER — Other Ambulatory Visit (HOSPITAL_COMMUNITY): Payer: Self-pay

## 2020-12-02 ENCOUNTER — Other Ambulatory Visit (HOSPITAL_COMMUNITY): Payer: Self-pay

## 2020-12-03 ENCOUNTER — Other Ambulatory Visit (HOSPITAL_COMMUNITY): Payer: Self-pay

## 2020-12-05 ENCOUNTER — Other Ambulatory Visit (HOSPITAL_COMMUNITY): Payer: Self-pay

## 2020-12-10 ENCOUNTER — Other Ambulatory Visit (HOSPITAL_COMMUNITY): Payer: Self-pay

## 2020-12-15 ENCOUNTER — Other Ambulatory Visit (HOSPITAL_COMMUNITY): Payer: Self-pay

## 2020-12-16 ENCOUNTER — Other Ambulatory Visit (HOSPITAL_COMMUNITY): Payer: Self-pay

## 2020-12-17 ENCOUNTER — Other Ambulatory Visit (HOSPITAL_COMMUNITY): Payer: Self-pay

## 2020-12-22 ENCOUNTER — Other Ambulatory Visit (HOSPITAL_COMMUNITY): Payer: Self-pay

## 2020-12-23 ENCOUNTER — Other Ambulatory Visit (HOSPITAL_COMMUNITY): Payer: Self-pay

## 2020-12-26 ENCOUNTER — Other Ambulatory Visit (HOSPITAL_COMMUNITY): Payer: Self-pay

## 2021-01-01 ENCOUNTER — Other Ambulatory Visit (HOSPITAL_COMMUNITY): Payer: Self-pay

## 2021-01-01 MED ORDER — TAZAROTENE 0.1 % EX CREA
TOPICAL_CREAM | CUTANEOUS | 2 refills | Status: DC
  Filled 2021-01-01: qty 30, 30d supply, fill #0

## 2021-01-20 ENCOUNTER — Other Ambulatory Visit (HOSPITAL_COMMUNITY): Payer: Self-pay

## 2021-09-14 ENCOUNTER — Other Ambulatory Visit: Payer: Self-pay

## 2021-09-14 ENCOUNTER — Emergency Department (HOSPITAL_BASED_OUTPATIENT_CLINIC_OR_DEPARTMENT_OTHER): Payer: No Typology Code available for payment source | Admitting: Radiology

## 2021-09-14 ENCOUNTER — Emergency Department (HOSPITAL_BASED_OUTPATIENT_CLINIC_OR_DEPARTMENT_OTHER)
Admission: EM | Admit: 2021-09-14 | Discharge: 2021-09-14 | Disposition: A | Discharge status: Home / Self Care | Payer: No Typology Code available for payment source | Attending: Emergency Medicine | Admitting: Emergency Medicine

## 2021-09-14 DIAGNOSIS — S62633A Displaced fracture of distal phalanx of left middle finger, initial encounter for closed fracture: Secondary | ICD-10-CM | POA: Insufficient documentation

## 2021-09-14 DIAGNOSIS — W230XXA Caught, crushed, jammed, or pinched between moving objects, initial encounter: Secondary | ICD-10-CM | POA: Insufficient documentation

## 2021-09-14 DIAGNOSIS — S6010XA Contusion of unspecified finger with damage to nail, initial encounter: Secondary | ICD-10-CM

## 2021-09-14 DIAGNOSIS — S6992XA Unspecified injury of left wrist, hand and finger(s), initial encounter: Secondary | ICD-10-CM | POA: Diagnosis present

## 2021-09-14 DIAGNOSIS — Y92812 Truck as the place of occurrence of the external cause: Secondary | ICD-10-CM | POA: Diagnosis not present

## 2021-09-14 DIAGNOSIS — S62639A Displaced fracture of distal phalanx of unspecified finger, initial encounter for closed fracture: Secondary | ICD-10-CM

## 2021-09-14 NOTE — ED Triage Notes (Signed)
C/O left middle finger injury; smashed against truck door.  ?

## 2021-09-14 NOTE — ED Provider Notes (Cosign Needed)
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  Bruising and pain to the distal finger.  Normal sensation and full range of motion of the hand though with significant pain when he flexes the finger.  I personally reviewed this patient's medical records.  Has history of pectus excavatum.  He is not on any medications daily.  He is up-to-date on his tetanus vaccine.  HPI     Home Medications Prior to Admission medications   Medication Sig Start Date End Date Taking? Authorizing Provider  tazarotene (AVAGE) 0.1 % cream Apply topically to affected area once daily in the evening 11/25/20     tazarotene (AVAGE) 0.1 % cream Apply1 application externally once daily in the evening 01/01/21         Allergies    Patient has no known allergies.    Review of Systems   Review of Systems  Musculoskeletal:        Finger injury   Physical Exam Updated Vital Signs BP (!) 160/92 (BP Location: Right Arm)    Pulse (!) 56    Temp 98.1 F (36.7 C)    Resp 18    Ht 6\' 3"  (1.905 m)    Wt 99.8 kg    SpO2 100%    BMI 27.50 kg/m  Physical Exam Vitals and nursing note reviewed.  HENT:     Head: Normocephalic and atraumatic.  Eyes:     General: No scleral icterus.       Right eye: No discharge.        Left eye: No discharge.     Conjunctiva/sclera: Conjunctivae normal.  Pulmonary:     Effort: Pulmonary effort is normal.  Musculoskeletal:       Hands:     Comments: Normal neurovascular status in all digits of the left hand.  Skin:    General: Skin is warm and dry.     Capillary Refill: Capillary refill takes less than 2 seconds.  Neurological:     General: No focal deficit present.     Mental Status: He is  alert.  Psychiatric:        Mood and Affect: Mood normal.    ED Results / Procedures / Treatments   Labs (all labs ordered are listed, but only abnormal results are displayed) Labs Reviewed - No data to display  EKG None  Radiology DG Finger Middle Left  Result Date: 09/14/2021 CLINICAL DATA:  Closed finger in door EXAM: LEFT MIDDLE FINGER 2+V COMPARISON:  None. FINDINGS: There is a fracture noted through the tip of the left middle finger distal phalanx. No displacement. Overlying soft tissues intact. No subluxation or dislocation. IMPRESSION: Left middle finger tuft fracture. Electronically Signed   By: 09/16/2021 M.D.   On: 09/14/2021 20:04    Procedures .03/15/2023Incision and Drainage  Date/Time: 09/14/2021 10:42 PM Performed by: 09/16/2021, PA-C Authorized by: Paris Lore, PA-C   Consent:    Consent obtained:  Verbal   Consent given by:  Patient   Risks discussed:  Bleeding, incomplete drainage, infection and pain   Alternatives discussed:  No treatment Universal protocol:    Procedure explained and questions answered to  patient or proxy's satisfaction: yes     Relevant documents present and verified: yes     Test results available : yes     Imaging studies available: yes     Required blood products, implants, devices, and special equipment available: yes     Site/side marked: yes     Immediately prior to procedure, a time out was called: yes     Patient identity confirmed:  Verbally with patient Location:    Type:  Subungual hematoma   Location:  Upper extremity   Upper extremity location:  Finger   Finger location:  L long finger Pre-procedure details:    Skin preparation:  Antiseptic wash Sedation:    Sedation type:  None Anesthesia:    Anesthesia method:  None Procedure type:    Complexity:  Simple Post-procedure details:    Procedure completion:  Tolerated well, no immediate complications Comments:     Subungual hematoma was drained using  electrocautery trephination of the nail.  Patient tolerated this comfortably without pain.    Medications Ordered in ED Medications - No data to display  ED Course/ Medical Decision Making/ A&P                           Medical Decision Making 31 year old male with injury to the left distal finger.  Hypertensive on intake vital signs otherwise normal.  Patient neurovascular intact in all 5 digits of the left hand.  Bruising and tenderness palpation over the distal left middle finger.  Amount and/or Complexity of Data Reviewed Radiology: ordered.    Details: Images visualized by this provider.  Tuft fracture of the distal phalanx of the left middle finger.   Subungual hematoma drained.  Left middle finger splinted due to distal tuft fracture.  Patient tolerated the procedures well.  Steven voiced understanding of his medical evaluation and treatment plan.  His questions answered to his expressed satisfaction.  Return precautions given.  Patient is well-appearing, stable, and was discharged in good condition.  This chart was dictated using voice recognition software, Dragon. Despite the best efforts of this provider to proofread and correct errors, errors may still occur which can change documentation meaning.   Final Clinical Impression(s) / ED Diagnoses Final diagnoses:  None    Rx / DC Orders ED Discharge Orders     None         Paris Lore, PA-C 09/14/21 2244

## 2021-09-14 NOTE — Discharge Instructions (Signed)
You were seen in the ER today for your finger injury.  Your finger does have a fracture in the last joint of your middle finger underneath of the nail.  You were placed in a splint for your finger fracture.  Please wear this as directed.  Keep your hand clean and dry.  The hematoma under your nail was drained today in the emergency department using the electrocautery.  Please follow your primary care doctor and return to the ER with any severe symptoms ?

## 2021-10-16 NOTE — Progress Notes (Signed)
? ?  I, Christoper Fabian, LAT, ATC, am serving as scribe for Dr. Clementeen Graham. ? ?Steven Shields is a 31 y.o. male who presents to [facility] today for low back pain.  He was last seen byDr. [name] on 05/20/20 for R knee pain.  Today, pt reports LBP x years w/ no specific MOI noted.  His LBP flared up about 2 months ago.  He locates his pain to R lower back w/ some radiating pain into his R glute.  He works as a IT sales professional ? ?Radiating pain: into the R buttock ?LE paresthesias: no ?Aggravating factors: squats, deadlifts; picking up anything when flared up; lumbar flexion; prolonged sitting ?Treatments tried: Land; Advil;  ? ? ?Pertinent review of systems: No fevers or chills ? ?Relevant historical information: Sleep apnea ? ? ?Exam:  ?BP 110/76 (BP Location: Right Arm, Patient Position: Sitting, Cuff Size: Normal)   Pulse 64   Ht 6\' 3"  (1.905 m)   Wt 215 lb 12.8 oz (97.9 kg)   SpO2 96%   BMI 26.97 kg/m?  ?General: Well Developed, well nourished, and in no acute distress.  ? ?MSK: L-spine: Nontender midline. ?Tender palpation right lumbar paraspinal musculature. ?Decreased lumbar motion especially to extension. ?Lower extremity strength reflexes and sensation are intact distally. ? ? ? ?Lab and Radiology Results ? ?X-ray images L-spine obtained today personally and independently interpreted. ?DDD L5-S1 present.  No clear spondylolisthesis or pars defects per my read. ?Await formal radiology review ? ? ? ?Assessment and Plan: ?31 y.o. male with acute exacerbation of chronic low back pain.  He has had chronic low back pain with exacerbations for years and has had trials of chiropractic care and heating pad and TENS unit on his own.  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 ? ?Plan for trial of physical therapy to work on core strengthening and  check back in about 2 months.  Return sooner if needed. ? ? ?PDMP not reviewed this encounter. ?Orders Placed This Encounter  ?Procedures  ? DG Lumbar Spine Complete  ?  Standing Status:   Future  ?  Number of Occurrences:   1  ?  Standing Expiration Date:   11/18/2021  ?  Order Specific Question:   Reason for Exam (SYMPTOM  OR DIAGNOSIS REQUIRED)  ?  Answer:   low back pain  ?  Order Specific Question:   Preferred imaging location?  ?  Answer:   11/20/2021  ? Ambulatory referral to Physical Therapy  ?  Referral Priority:   Routine  ?  Referral Type:   Physical Medicine  ?  Referral Reason:   Specialty Services Required  ?  Requested Specialty:   Physical Therapy  ?  Number of Visits Requested:   1  ? ?No orders of the defined types were placed in this encounter. ? ? ? ?Discussed warning signs or symptoms. Please see discharge instructions. Patient expresses understanding. ? ? ?The above documentation has been reviewed and is accurate and complete Kyra Searles, M.D. ? ? ?

## 2021-10-19 ENCOUNTER — Ambulatory Visit: Payer: 59 | Admitting: Family Medicine

## 2021-10-19 ENCOUNTER — Ambulatory Visit (INDEPENDENT_AMBULATORY_CARE_PROVIDER_SITE_OTHER): Payer: 59

## 2021-10-19 ENCOUNTER — Encounter: Payer: Self-pay | Admitting: Family Medicine

## 2021-10-19 VITALS — BP 110/76 | HR 64 | Ht 75.0 in | Wt 215.8 lb

## 2021-10-19 DIAGNOSIS — M545 Low back pain, unspecified: Secondary | ICD-10-CM

## 2021-10-19 DIAGNOSIS — G8929 Other chronic pain: Secondary | ICD-10-CM

## 2021-10-19 NOTE — Patient Instructions (Addendum)
Good to see you today. ? ?I've referred you to Physical Therapy.  Their office will call you to schedule but please let us know if you don't hear from them in one week regarding scheduling. ? ?Please get an Xray today before you leave. ? ?Follow-up: 8 weeks ? ?Stax splint for finger ?

## 2021-10-20 NOTE — Progress Notes (Signed)
Lumbar spine x-ray shows some arthritis changes. No pars defect present.  ?Plan for PT.

## 2021-10-22 ENCOUNTER — Encounter: Payer: 59 | Admitting: Family Medicine

## 2021-11-02 ENCOUNTER — Ambulatory Visit: Payer: 59 | Attending: Family Medicine | Admitting: Physical Therapy

## 2021-11-02 ENCOUNTER — Encounter: Payer: Self-pay | Admitting: Physical Therapy

## 2021-11-02 DIAGNOSIS — R252 Cramp and spasm: Secondary | ICD-10-CM | POA: Diagnosis present

## 2021-11-02 DIAGNOSIS — M5459 Other low back pain: Secondary | ICD-10-CM | POA: Diagnosis present

## 2021-11-02 DIAGNOSIS — R29898 Other symptoms and signs involving the musculoskeletal system: Secondary | ICD-10-CM | POA: Insufficient documentation

## 2021-11-02 DIAGNOSIS — M545 Low back pain, unspecified: Secondary | ICD-10-CM | POA: Diagnosis not present

## 2021-11-02 DIAGNOSIS — G8929 Other chronic pain: Secondary | ICD-10-CM | POA: Insufficient documentation

## 2021-11-02 NOTE — Therapy (Signed)
[place] [facility] ?[id number] ?[place], [place], [address] ?Phone: [phone]   Fax:  [phone] ? ?Physical Therapy Evaluation ? ?Patient Details  ?Name: Steven Shields ?MRN: [id number] ?Date of Birth: Feb 23, 1991 ?Referring Provider (PT): Clementeen GrahamEvan Corey ? ? ?Encounter Date: 11/02/2021 ? ? PT End of Session - 11/02/21 0935   ? ? Visit Number 1   ? Number of Visits 9   ? Date for PT Re-Evaluation 12/28/21   ? Authorization Type UHC   ? Authorization Time Period 11/02/21 to 12/28/21   ? PT Start Time [phone]   ? PT Stop Time 0924   ? PT Time Calculation (min) 38 min   ? Activity Tolerance Patient tolerated treatment well   ? Behavior [facility] for tasks assessed/performed   ? ?  ?  ? ?  ? ? ?Past Medical History:  ?Diagnosis Date  ? Acne   ? Pectus excavatum   ? ? ?History reviewed. No pertinent surgical history. ? ?There were no vitals filed for this visit. ? ? ? Subjective Assessment - 11/02/21 0848   ? ? Subjective My back has been a problem for years, sometimes it will lock up and I can't move a lot. Sometimes at work it will act up and I can't do what I need to do. I've noticed it will flare up when I deadlift or squat, but other times I have no symptoms. Sometimes pain will last a month, sometimes it will last a week. I can feel it there right now. Nothing helps it to go away, I've tried a chiropractor, I tried TENS, stretching has helped the most actually. I've been trying to stretch more, I don't stretch as much as I should when I work out. It started around when I finished highschool but I don't remember any specific thing happening, its gotten worse over the years. I can't do anything when its bad. Chiropractor stretched a lot and worked out a lot with me and put me on TENS and heat. 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 Varies as to how often it gets this bad, maybe gets this bad once every 2-3 months.   ? Patient  Stated Goals figure out what's causing this and get rid of it   ? Currently in Pain? No/denies   but can feel where its at, just doesn't hurt right now; at worst can be 8-9/10  ? ?  ?  ? ?  ? ? ? ? ? [facility] - 11/02/21 0001   ? ?  ? Assessment  ? Medical Diagnosis low back pain   ? Referring Provider (PT) Clementeen GrahamEvan Corey   ? Onset Date/Surgical Date --   chronic  ? Next MD Visit Denyse AmassCorey in June   ? Prior Therapy none   ?  ? Precautions  ? Precautions None   ?  ? Restrictions  ? Weight Bearing Restrictions No   ?  ? Balance Screen  ? Has the patient fallen in the past 6 months No   ? Has the patient had a decrease in activity level because of a fear of falling?  No   ? Is the patient reluctant to leave their home because of a fear of falling?  No   ?  ? Home Environment  ? Living Environment Private residence   ?  ? Prior Function  ? Level of Independence Independent;Independent with basic ADLs;Independent with  gait;Independent with transfers   ? Vocation Full time employment   ? Vocation Oceanographer and works at [employer]   repeated motion bending/lifting at range  ? Leisure work   ?  ? Observation/Other Assessments  ? Observations core weakness, unable to maintain PPT with double leg lift and lower; squat form looks ok, did have excessive rounding with deadlift which may be contributing to problems too   ? Focus on Therapeutic Outcomes (FOTO)  63   ?  ? ROM / Strength  ? AROM / PROM / Strength AROM;Strength   ?  ? AROM  ? AROM Assessment Site Lumbar   ? Lumbar Flexion mild limitations; RFIS   ? Lumbar Extension WNL; REIS improved sx   ? Lumbar - Right Side Bend WNL   ? Lumbar - Left Side Bend WNL   ?  ? Strength  ? Strength Assessment Site Hip;Knee;Ankle   ? Right/Left Hip Left;Right   ? Right Hip Flexion 4/5   ? Right Hip Extension 4+/5   ? Right Hip ABduction 4+/5   ? Left Hip Flexion 4+/5   ? Left Hip Extension 4+/5   ? Left Hip ABduction 4+/5   ? Right/Left Knee Left;Right   ? Right  Knee Flexion 4/5   ? Right Knee Extension 4+/5   ? Left Knee Flexion 4/5   ? Left Knee Extension 4+/5   ? Right/Left Ankle Right;Left   ? Right Ankle Dorsiflexion 5/5   ? Left Ankle Dorsiflexion 5/5   ?  ? Flexibility  ? Soft Tissue Assessment /Muscle Length yes   ? Hamstrings severe limitation   ? Quadriceps moderate limitation, increase in pain with quad stretch   ? Piriformis mild limitation   ?  ? Palpation  ? Palpation comment spasm noted R lumbar paraspinals TTP   ? ?  ?  ? ?  ? ? ? ? ? ? ? ? ? ? ? ? ? ?Objective measurements completed on examination: See above findings.  ? ? ? ? ? [facility] Adult PT Treatment/Exercise - 11/02/21 0001   ? ?  ? Exercises  ? Exercises Knee/Hip   ?  ? Knee/Hip Exercises: Stretches  ? Active Hamstring Stretch Both;3 reps;30 seconds   ? Other Knee/Hip Stretches pigeon hip stretch 2x30 seconds B   ?  ? Knee/Hip Exercises: Supine  ? Other Supine Knee/Hip Exercises double bent knee lift with PPT 1x10   ?  ? Knee/Hip Exercises: Prone  ? Other Prone Exercises prone extensions 1x10   ? ?  ?  ? ?  ? ? ? ? ? ? ? ? ? ? PT Education - 11/02/21 0934   ? ? Education Details exam findings, POC, HEP   ? Person(s) Educated Patient   ? Methods Explanation;Demonstration   ? Comprehension Verbalized understanding;Returned demonstration   ? ?  ?  ? ?  ? ? ? PT Short Term Goals - 11/02/21 0947   ? ?  ? PT SHORT TERM GOAL #1  ? Title Will be compliant with appropriate progressive HEP   ? Time 4   ? Period Weeks   ? Status New   ? Target Date 11/30/21   ?  ? PT SHORT TERM GOAL #2  ? Title Will have good understanding of functional biomechancs and posture   ? Time 4   ? Period Weeks   ? Status New   ? ?  ?  ? ?  ? ? ? ?  PT Long Term Goals - 11/02/21 0948   ? ?  ? PT LONG TERM GOAL #1  ? Title MMT to improve by one grade in all weak groups   ? Time 8   ? Period Weeks   ? Status New   ? Target Date 12/28/21   ?  ? PT LONG TERM GOAL #2  ? Title Pain to be no more than 3/10 at worst   ? Time 8   ? Period Weeks    ? Status New   ?  ? PT LONG TERM GOAL #3  ? Title Will be able to perform job duties including floor to stand and repeated lifting without increase in pain   ? Time 8   ? Period Weeks   ? Status New   ?  ? PT LONG TERM GOAL #4  ? Title Will demonstrate improved core strength as evidenced by ability to maintain standard plank for at least 60 seconds and 10 double leg lifts without lumbar arching   ? Time 8   ? Period Weeks   ? Status New   ? ?  ?  ? ?  ? ? ? ? ? ? ? ? ? Plan - 11/02/21 0940   ? ? Clinical Impression Statement [place] arrives today doing OK, has been dealing with on and off back pain for years now. Stretching helps, but it does get to the point every other month where he has trouble getting up from the floor at work due to pain. Exam reveals some functional muscle weakness, significant mm flexibility impairment, core weakness, and poor form with tasks such as deadlifts. May have a bit of an extension bias too.  I do think that biomechanics may be contributing to his pain since he does have to do repeated lifts at work.   ? Personal Factors and Comorbidities Time since onset of injury/illness/exacerbation   ? Examination-Activity Limitations Transfers;Reach Overhead;Squat;Stand;Lift   ? Examination-Participation Restrictions Occupation;Community Activity;Interpersonal Relationship;Volunteer;Pincus Badder Work   ? Stability/Clinical Decision Making Stable/Uncomplicated   ? Clinical Decision Making Low   ? Rehab Potential Good   ? PT Frequency 1x / week   ? PT Duration 8 weeks   ? PT Treatment/Interventions ADLs/Self Care Home Management;Cryotherapy;Electrical Stimulation;Iontophoresis 4mg /ml Dexamethasone;Moist Heat;Traction;Ultrasound;Functional mobility training;Therapeutic activities;Therapeutic exercise;Balance training;Neuromuscular re-education;Patient/family education;Manual techniques;Passive range of motion;Dry needling;Taping;Spinal Manipulations   ? PT Next Visit Plan update HEP each session; core  strength, functional biomechanics, finetune deadlift form, general strength   ? PT Home Exercise Plan [id number]   ? Consulted and Agree with Plan of Care Patient   ? ?  ?  ? ?  ? ? ?Patient will benefit from

## 2021-11-09 ENCOUNTER — Ambulatory Visit: Payer: 59 | Admitting: Physical Therapy

## 2021-11-09 ENCOUNTER — Encounter: Payer: Self-pay | Admitting: Physical Therapy

## 2021-11-09 DIAGNOSIS — M5459 Other low back pain: Secondary | ICD-10-CM | POA: Diagnosis not present

## 2021-11-09 DIAGNOSIS — R29898 Other symptoms and signs involving the musculoskeletal system: Secondary | ICD-10-CM

## 2021-11-09 DIAGNOSIS — R252 Cramp and spasm: Secondary | ICD-10-CM

## 2021-11-09 NOTE — Therapy (Signed)
[place] ?[facility] ?[address] ?[place], [place], [address] ?Phone: [phone]   Fax:  [phone] ? ?Physical Therapy Treatment ? ?Patient Details  ?Name: Steven Shields ?MRN: [id number] ?Date of Birth: Jul 03, 1991 ?Referring Provider (PT): Clementeen Graham ? ? ?Encounter Date: 11/09/2021 ? ? PT End of Session - 11/09/21 1151   ? ? Visit Number 2   ? Number of Visits 9   ? Date for PT Re-Evaluation 12/28/21   ? Authorization Type UHC   ? Authorization Time Period 11/02/21 to 12/28/21   ? PT Start Time 1105   ? PT Stop Time 1144   ? PT Time Calculation (min) 39 min   ? Activity Tolerance Patient tolerated treatment well   ? Behavior During Therapy [facility] for tasks assessed/performed   ? ?  ?  ? ?  ? ? ?Past Medical History:  ?Diagnosis Date  ? Acne   ? Pectus excavatum   ? ? ?History reviewed. No pertinent surgical history. ? ?There were no vitals filed for this visit. ? ? Subjective Assessment - 11/09/21 1105   ? ? Subjective My back has been worse this week, I can feel it a lot more than before. Just working more HEP has been doing OK   ? Patient Stated Goals figure out what's causing this and get rid of it   ? Currently in Pain? Yes   ? Pain Score 4    ? Pain Location Back   ? Pain Orientation Right   ? Pain Descriptors / Indicators Sore   ? Pain Type Chronic pain   ? ?  ?  ? ?  ? ? ? ? ? ? ? ? ? ? ? ? ? ? ? ? ? ? ? ? [facility] Adult PT Treatment/Exercise - 11/09/21 0001   ? ?  ? Knee/Hip Exercises: Stretches  ? Other Knee/Hip Stretches QL stretch 3 way 2x30 seconds B, childs pose stretch 3 way 2x30 seconds each   ?  ? Knee/Hip Exercises: Supine  ? Other Supine Knee/Hip Exercises forward and side planks on knees 2x30 seconds each   ?  ? Knee/Hip Exercises: Prone  ? Other Prone Exercises quadruped bird dogs 1x10 B, ipsilateral bird dogs 1x5 B   ?  ? Manual Therapy  ? Manual Therapy Soft tissue mobilization   ? Soft tissue mobilization R lumbar paraspinals at L5 level IASTM   ? ?   ?  ? ?  ? ? ? ? ? ? ? ? ? ? PT Education - 11/09/21 1151   ? ? Education Details HEP updates, possible DN next session   ? Person(s) Educated Patient   ? Methods Explanation   ? Comprehension Verbalized understanding   ? ?  ?  ? ?  ? ? ? PT Short Term Goals - 11/02/21 0947   ? ?  ? PT SHORT TERM GOAL #1  ? Title Will be compliant with appropriate progressive HEP   ? Time 4   ? Period Weeks   ? Status New   ? Target Date 11/30/21   ?  ? PT SHORT TERM GOAL #2  ? Title Will have good understanding of functional biomechancs and posture   ? Time 4   ? Period Weeks   ? Status New   ? ?  ?  ? ?  ? ? ? ? PT Long Term Goals - 11/02/21 0948   ? ?  ? PT LONG TERM GOAL #  1  ? Title MMT to improve by one grade in all weak groups   ? Time 8   ? Period Weeks   ? Status New   ? Target Date 12/28/21   ?  ? PT LONG TERM GOAL #2  ? Title Pain to be no more than 3/10 at worst   ? Time 8   ? Period Weeks   ? Status New   ?  ? PT LONG TERM GOAL #3  ? Title Will be able to perform job duties including floor to stand and repeated lifting without increase in pain   ? Time 8   ? Period Weeks   ? Status New   ?  ? PT LONG TERM GOAL #4  ? Title Will demonstrate improved core strength as evidenced by ability to maintain standard plank for at least 60 seconds and 10 double leg lifts without lumbar arching   ? Time 8   ? Period Weeks   ? Status New   ? ?  ?  ? ?  ? ? ? ? ? ? ? ? Plan - 11/09/21 1151   ? ? Clinical Impression Statement Steven arrives today doing OK, having more back pain this week than before. We were able to address the tight area in his back with some targeted stretches, otherwise worked on core work and finished with some manual at the end of the session. Might benefit from DN next session.   ? Personal Factors and Comorbidities Time since onset of injury/illness/exacerbation   ? Examination-Activity Limitations Transfers;Reach Overhead;Squat;Stand;Lift   ? Examination-Participation Restrictions Occupation;Community  Activity;Interpersonal Relationship;Volunteer;Pincus Badder Work   ? Stability/Clinical Decision Making Stable/Uncomplicated   ? Clinical Decision Making Low   ? Rehab Potential Good   ? PT Frequency 1x / week   ? PT Duration 8 weeks   ? PT Treatment/Interventions ADLs/Self Care Home Management;Cryotherapy;Electrical Stimulation;Iontophoresis 4mg /ml Dexamethasone;Moist Heat;Traction;Ultrasound;Functional mobility training;Therapeutic activities;Therapeutic exercise;Balance training;Neuromuscular re-education;Patient/family education;Manual techniques;Passive range of motion;Dry needling;Taping;Spinal Manipulations   ? PT Next Visit Plan update HEP each session; core strength, functional biomechanics, finetune deadlift form, general strength   ? PT Home Exercise Plan [id number]   ? Consulted and Agree with Plan of Care Patient   ? ?  ?  ? ?  ? ? ?Patient will benefit from skilled therapeutic intervention in order to improve the following deficits and impairments:  Decreased range of motion, Increased fascial restricitons, Increased muscle spasms, Pain, Impaired flexibility, Improper body mechanics, Decreased strength, Postural dysfunction ? ?Visit Diagnosis: ?Other low back pain ? ?Other symptoms and signs involving the musculoskeletal system ? ?Cramp and spasm ? ? ? ? ?Problem List ?Patient Active Problem List  ? Diagnosis Date Noted  ? Healthcare maintenance 10/21/2020  ? OSA (obstructive sleep apnea) 11/26/2017  ? Insomnia 11/26/2017  ? Pancreatitis 01/20/2016  ? RUQ abdominal pain 01/17/2016  ? ?Latacha Texeira U PT, DPT, PN2  ? ?Supplemental Physical Therapist ?[place]  ? ? ? ? ? ?[place] [facility] ?[address] ?[place], [place], [place] ?Phone: [phone]   Fax:  [phone] ? ?Name: [phone] T Iturralde ?MRN: Steven ?Date of Birth: 29-Oct-1990 ? ? ? ?

## 2021-11-17 ENCOUNTER — Ambulatory Visit: Payer: 59 | Admitting: Physical Therapy

## 2021-11-18 ENCOUNTER — Encounter: Payer: Self-pay | Admitting: Family Medicine

## 2021-11-18 ENCOUNTER — Ambulatory Visit (INDEPENDENT_AMBULATORY_CARE_PROVIDER_SITE_OTHER): Payer: 59 | Admitting: Family Medicine

## 2021-11-18 VITALS — BP 140/78 | HR 84 | Temp 97.9°F | Ht 75.0 in | Wt 221.8 lb

## 2021-11-18 DIAGNOSIS — Z0000 Encounter for general adult medical examination without abnormal findings: Secondary | ICD-10-CM | POA: Diagnosis not present

## 2021-11-18 DIAGNOSIS — R7401 Elevation of levels of liver transaminase levels: Secondary | ICD-10-CM | POA: Diagnosis not present

## 2021-11-18 DIAGNOSIS — R6882 Decreased libido: Secondary | ICD-10-CM | POA: Diagnosis not present

## 2021-11-18 DIAGNOSIS — L700 Acne vulgaris: Secondary | ICD-10-CM | POA: Insufficient documentation

## 2021-11-18 DIAGNOSIS — R030 Elevated blood-pressure reading, without diagnosis of hypertension: Secondary | ICD-10-CM | POA: Diagnosis not present

## 2021-11-18 NOTE — Progress Notes (Signed)
? ?Established Patient Office Visit ? ?Subjective   ?Patient ID: Steven Shields, male    DOB: [birth year]  Age: 31 y.o. MRN: [id number] ? ?Chief Complaint  ?Patient presents with  ? Annual Exam  ?  CPE, no concerns. Patient not fasting.   ? ? ?HPI presents for physical exam.  He is not fasting today.  Continues to work for the [employer].  He is exercising regularly and has regular dental care.  He is about to become engaged.  Tells decreased libido with some fatigue.  Has 2-3 beers a few nights a week.  Blood pressure has been normal and elevated in the past. ? ? ? ? ? ?Review of Systems  ?Constitutional: Negative.   ?HENT: Negative.    ?Eyes:  Negative for blurred vision, discharge and redness.  ?Respiratory: Negative.    ?Cardiovascular: Negative.   ?Gastrointestinal:  Negative for abdominal pain.  ?Genitourinary: Negative.   ?Musculoskeletal: Negative.  Negative for myalgias.  ?Skin:  Negative for rash.  ?Neurological:  Negative for tingling, loss of consciousness, weakness and headaches.  ?Endo/Heme/Allergies:  Negative for polydipsia.  ? ?  ?Objective:  ?  ? ?BP 140/78 (BP Location: Right Arm, Patient Position: Sitting, Cuff Size: Large)   Pulse 84   Temp 97.9 ?F (36.6 ?C) (Temporal)   Ht 6\' 3"  (1.905 m)   Wt 221 lb 12.8 oz (100.6 kg)   SpO2 97%   BMI 27.72 kg/m?  ?BP Readings from Last 3 Encounters:  ?11/18/21 140/78  ?10/19/21 110/76  ?09/14/21 (!) 144/85  ? ?Wt Readings from Last 3 Encounters:  ?11/18/21 221 lb 12.8 oz (100.6 kg)  ?10/19/21 215 lb 12.8 oz (97.9 kg)  ?09/14/21 220 lb (99.8 kg)  ? ?  ? ?Physical Exam ?Constitutional:   ?   General: He is not in acute distress. ?   Appearance: Normal appearance. He is not ill-appearing, toxic-appearing or diaphoretic.  ?HENT:  ?   Head: Normocephalic and atraumatic.  ?   Right Ear: External ear normal.  ?   Left Ear: External ear normal.  ?   Mouth/Throat:  ?   Mouth: Mucous membranes are moist.  ?   Pharynx: Oropharynx is clear. No oropharyngeal  exudate or posterior oropharyngeal erythema.  ?Eyes:  ?   General: No scleral icterus.    ?   Right eye: No discharge.     ?   Left eye: No discharge.  ?   Extraocular Movements: Extraocular movements intact.  ?   Conjunctiva/sclera: Conjunctivae normal.  ?   Pupils: Pupils are equal, round, and reactive to light.  ?Cardiovascular:  ?   Rate and Rhythm: Normal rate and regular rhythm.  ?Pulmonary:  ?   Effort: Pulmonary effort is normal. No respiratory distress.  ?   Breath sounds: Normal breath sounds.  ?Abdominal:  ?   General: Bowel sounds are normal.  ?   Tenderness: There is no abdominal tenderness. There is no guarding.  ?   Hernia: There is no hernia in the left inguinal area or right inguinal area.  ?Genitourinary: ?   Penis: Circumcised. No hypospadias, erythema, tenderness, discharge, swelling or lesions.   ?   Testes:     ?   Right: Mass, tenderness or swelling not present. Right testis is descended.     ?   Left: Mass, tenderness or swelling not present. Left testis is descended.  ?   Epididymis:  ?   Right: Not inflamed or enlarged.  ?  Left: Not inflamed or enlarged.  ?Musculoskeletal:  ?   Cervical back: No rigidity or tenderness.  ?Lymphadenopathy:  ?   Lower Body: No right inguinal adenopathy. No left inguinal adenopathy.  ?Skin: ?   General: Skin is warm and dry.  ?Neurological:  ?   Mental Status: He is alert and oriented to person, place, and time.  ?Psychiatric:     ?   Mood and Affect: Mood normal.     ?   Behavior: Behavior normal.  ? ? ? ?No results found for any visits on 11/18/21. ? ? ? ?The ASCVD Risk score (Arnett DK, et al., 2019) failed to calculate for the following reasons: ?  The 2019 ASCVD risk score is only valid for ages 54 to 46 ? ?  ?Assessment & Plan:  ? ?Problem List Items Addressed This Visit   ? ?  ? Other  ? Healthcare maintenance - Primary  ? Relevant Orders  ? CBC  ? Basic metabolic panel  ? Lipid panel  ? Urinalysis, Routine w reflex microscopic  ? Elevated AST (SGOT)   ? Relevant Orders  ? Hepatic function panel  ? Hepatitis C antibody  ? Hepatitis B Surface AntiGEN  ? Libido, decreased  ? Relevant Orders  ? Testosterone Total,Free,Bio, Males-([facility])  ? Elevated BP without diagnosis of hypertension  ? ? ?Return in about 6 months (around 05/21/2022), or return fasting for blood work.Marland Kitchen  ?Return fasting for above ordered blood work.  Information was given about preventing hypertension.  Advised that alcohol does elevate his blood pressure and could be responsible for the elevation of his AST.  Advised no more than 2 servings of alcohol in a given setting.  Information was given on health maintenance and preventive care. ? ?Mliss Sax, MD ? ?

## 2021-11-20 ENCOUNTER — Encounter: Payer: Self-pay | Admitting: Physical Therapy

## 2021-11-20 ENCOUNTER — Ambulatory Visit: Payer: 59 | Admitting: Physical Therapy

## 2021-11-20 DIAGNOSIS — R252 Cramp and spasm: Secondary | ICD-10-CM

## 2021-11-20 DIAGNOSIS — M5459 Other low back pain: Secondary | ICD-10-CM | POA: Diagnosis not present

## 2021-11-20 DIAGNOSIS — R29898 Other symptoms and signs involving the musculoskeletal system: Secondary | ICD-10-CM

## 2021-11-20 NOTE — Therapy (Signed)
[place], [place], [address] Phone: [phone]   Fax:  [phone]  Physical Therapy Treatment  Patient Details  Name: Steven Shields MRN: [id number] Date of Birth: 1991/03/07 Referring Provider (PT): Lynne Leader   Encounter Date: 11/20/2021   PT End of Session - 11/20/21 0803     Visit Number 3    Number of Visits 9    Date for PT Re-Evaluation 12/28/21    Authorization Type UHC    PT Start Time 0758    PT Stop Time 0856    PT Time Calculation (min) 58 min    Activity Tolerance Patient tolerated treatment well    Behavior During Therapy [facility] for tasks assessed/performed             Past Medical History:  Diagnosis Date   Acne    Pectus excavatum     History reviewed. No pertinent surgical history.  There were no vitals filed for this visit.   Subjective Assessment - 11/20/21 0804     Subjective REports no real changes, about the same, felt good after the STM for about an hour    Currently in Pain? No/denies                               [facility] Adult PT Treatment/Exercise - 11/20/21 0001       Knee/Hip Exercises: Stretches   Passive Hamstring Stretch Both;3 reps;20 seconds    Quad Stretch Both;3 reps;20 seconds    Piriformis Stretch Both;3 reps;20 seconds      Knee/Hip Exercises: Aerobic   Elliptical level 4 x 5 minutes      Knee/Hip Exercises: Machines for Strengthening   Other Machine rows 85# 3x10, lats 75# 3x10, 45# AR press, 15# hip abduction and extension      Knee/Hip Exercises: Standing   Other Standing Knee Exercises 45# farmer carry      Modalities   Modalities Traction      Traction   Type of Traction Lumbar    Max (lbs) 100    Rest Time static    Time 10                       PT Short Term Goals - 11/20/21 0931       PT SHORT TERM GOAL #1   Title Will be compliant with appropriate progressive HEP    Status  Partially Met      PT SHORT TERM GOAL #2   Title Will have good understanding of functional biomechancs and posture    Status Partially Met               PT Long Term Goals - 11/02/21 0948       PT LONG TERM GOAL #1   Title MMT to improve by one grade in all weak groups    Time 8    Period Weeks    Status New    Target Date 12/28/21      PT LONG TERM GOAL #2   Title Pain to be no more than 3/10 at worst    Time 8    Period Weeks    Status New      PT LONG TERM GOAL #3   Title Will be able to perform job duties including floor to stand and repeated lifting without increase in pain  Time 8    Period Weeks    Status New      PT LONG TERM GOAL #4   Title Will demonstrate improved core strength as evidenced by ability to maintain standard plank for at least 60 seconds and 10 double leg lifts without lumbar arching    Time 8    Period Weeks    Status New                   Plan - 11/20/21 0931     Clinical Impression Statement Patient REports still pain with lifting, no pain right now.  He is very tight in the HS, mild tightness in the piriformis, tight in the quads, I added some increased core stability exercises and he did well, did report fatigue and difficulty with AR press, we attempted 45 # deadlift and he has a little loss of the posterior pelvic tilt at the bottom and this is where he felt the back pain  I did try traction today, reported feeling better when he left    PT Next Visit Plan see how the traction did as well as the LE stretching    Consulted and Agree with Plan of Care Patient             Patient will benefit from skilled therapeutic intervention in order to improve the following deficits and impairments:  Decreased range of motion, Increased fascial restricitons, Increased muscle spasms, Pain, Impaired flexibility, Improper body mechanics, Decreased strength, Postural dysfunction  Visit Diagnosis: Other low back pain  Other symptoms  and signs involving the musculoskeletal system  Cramp and spasm     Problem List Patient Active Problem List   Diagnosis Date Noted   Acne vulgaris 11/18/2021   Elevated AST (SGOT) 11/18/2021   Libido, decreased 11/18/2021   Elevated BP without diagnosis of hypertension 11/18/2021   Healthcare maintenance 10/21/2020   OSA (obstructive sleep apnea) 11/26/2017   Insomnia 11/26/2017   Pancreatitis 01/20/2016   RUQ abdominal pain 01/17/2016    Steven Shields, PT 11/20/2021, 9:44 AM  [place]. [place], [place], [address] Phone: [phone]   Fax:  [phone]  Name: Steven Shields MRN: [id number] Date of Birth: 1991-03-05

## 2021-11-23 ENCOUNTER — Other Ambulatory Visit: Payer: 59

## 2021-11-23 LAB — LIPID PANEL
Cholesterol: 233 mg/dL — ABNORMAL HIGH (ref 0–200)
HDL: 94.2 mg/dL (ref >39.00)
LDL Cholesterol: 129 mg/dL — ABNORMAL HIGH (ref 0–99)
NonHDL: 138.33
Total CHOL/HDL Ratio: 2
Triglycerides: 45 mg/dL (ref 0.0–149.0)
VLDL: 9 mg/dL (ref 0.0–40.0)

## 2021-11-23 LAB — BASIC METABOLIC PANEL
BUN: 23 mg/dL (ref 6–23)
CO2: 31 mEq/L (ref 19–32)
Calcium: 10.2 mg/dL (ref 8.4–10.5)
Chloride: 99 mEq/L (ref 96–112)
Creatinine, Ser: 1.11 mg/dL (ref 0.40–1.50)
GFR: 90.25 mL/min (ref >60.00)
Glucose, Bld: 96 mg/dL (ref 70–99)
Potassium: 4 mEq/L (ref 3.5–5.1)
Sodium: 136 mEq/L (ref 135–145)

## 2021-11-23 LAB — CBC
HCT: 45.5 % (ref 39.0–52.0)
Hemoglobin: 15.3 g/dL (ref 13.0–17.0)
MCHC: 33.7 g/dL (ref 30.0–36.0)
MCV: 90 fl (ref 78.0–100.0)
Platelets: 211 10*3/uL (ref 150.0–400.0)
RBC: 5.06 Mil/uL (ref 4.22–5.81)
RDW: 12.8 % (ref 11.5–15.5)
WBC: 3.6 10*3/uL — ABNORMAL LOW (ref 4.0–10.5)

## 2021-11-23 LAB — URINALYSIS, ROUTINE W REFLEX MICROSCOPIC
Bilirubin Urine: NEGATIVE
Hgb urine dipstick: NEGATIVE
Ketones, ur: NEGATIVE
Leukocytes,Ua: NEGATIVE
Nitrite: NEGATIVE
RBC / HPF: NONE SEEN (ref 0–?)
Specific Gravity, Urine: <=1.005 — AB (ref 1.000–1.030)
Total Protein, Urine: NEGATIVE
Urine Glucose: NEGATIVE
Urobilinogen, UA: 0.2 (ref 0.0–1.0)
WBC, UA: NONE SEEN (ref 0–?)
pH: 7 (ref 5.0–8.0)

## 2021-11-23 LAB — HEPATIC FUNCTION PANEL
ALT: 35 U/L (ref 0–53)
AST: 44 U/L — ABNORMAL HIGH (ref 0–37)
Albumin: 4.9 g/dL (ref 3.5–5.2)
Alkaline Phosphatase: 50 U/L (ref 39–117)
Bilirubin, Direct: 0.3 mg/dL (ref 0.0–0.3)
Total Bilirubin: 1.6 mg/dL — ABNORMAL HIGH (ref 0.2–1.2)
Total Protein: 7.8 g/dL (ref 6.0–8.3)

## 2021-11-24 LAB — HEPATITIS C ANTIBODY
Hepatitis C Ab: NONREACTIVE
SIGNAL TO CUT-OFF: 0.07 (ref <1.00)

## 2021-11-24 LAB — TESTOSTERONE TOTAL,FREE,BIO, MALES
Albumin: 4.8 g/dL (ref 3.6–5.1)
Sex Hormone Binding: 26 nmol/L (ref 10–50)
Testosterone, Bioavailable: 220.1 ng/dL (ref 110.0–575.0)
Testosterone, Free: 100.6 pg/mL (ref 46.0–224.0)
Testosterone: 608 ng/dL (ref 250–827)

## 2021-11-24 LAB — HEPATITIS B SURFACE ANTIGEN: Hepatitis B Surface Ag: NONREACTIVE

## 2021-11-27 ENCOUNTER — Telehealth (INDEPENDENT_AMBULATORY_CARE_PROVIDER_SITE_OTHER): Payer: 59 | Admitting: Family Medicine

## 2021-11-27 ENCOUNTER — Encounter: Payer: Self-pay | Admitting: Family Medicine

## 2021-11-27 VITALS — Ht 75.0 in

## 2021-11-27 DIAGNOSIS — R6882 Decreased libido: Secondary | ICD-10-CM | POA: Diagnosis not present

## 2021-11-27 DIAGNOSIS — R030 Elevated blood-pressure reading, without diagnosis of hypertension: Secondary | ICD-10-CM

## 2021-11-27 DIAGNOSIS — R7401 Elevation of levels of liver transaminase levels: Secondary | ICD-10-CM | POA: Diagnosis not present

## 2021-11-27 NOTE — Progress Notes (Signed)
Established Patient Office Visit  Subjective   Patient ID: Steven Shields, male    DOB: 1990/12/10  Age: 31 y.o. MRN: [id number]  Chief Complaint  Patient presents with   Advice Only    Discuss labs     HPI for follow-up of recent lab work that revealed persisting small elevation in AST.  Testing for viral hepatitis was negative.  HDL cholesterol which has been highly favorable has actually increased.  Total bilirubin is elevated as it has been over the years for him.  Testosterone levels were normal.  Drinks 2-3 servings of alcohol 2 to 3 days/week.  Denies problems or issues with alcohol.    Review of Systems  Constitutional: Negative.   HENT: Negative.    Eyes:  Negative for blurred vision, discharge and redness.  Respiratory: Negative.    Cardiovascular: Negative.   Gastrointestinal:  Negative for abdominal pain.  Genitourinary: Negative.   Musculoskeletal: Negative.  Negative for myalgias.  Skin:  Negative for rash.  Neurological:  Negative for tingling, loss of consciousness and weakness.  Endo/Heme/Allergies:  Negative for polydipsia.     Objective:     Ht 6\' 3"  (1.905 m)   BMI 27.72 kg/m    Physical Exam Constitutional:      General: He is not in acute distress.    Appearance: Normal appearance. He is not ill-appearing, toxic-appearing or diaphoretic.  HENT:     Head: Normocephalic and atraumatic.     Right Ear: External ear normal.     Left Ear: External ear normal.  Eyes:     General: No scleral icterus.       Right eye: No discharge.        Left eye: No discharge.     Extraocular Movements: Extraocular movements intact.     Conjunctiva/sclera: Conjunctivae normal.  Pulmonary:     Effort: Pulmonary effort is normal. No respiratory distress.  Musculoskeletal:     Cervical back: No rigidity or tenderness.  Skin:    General: Skin is warm and dry.  Neurological:     Mental Status: He is alert and oriented to person, place, and time.  Psychiatric:         Mood and Affect: Mood normal.        Behavior: Behavior normal.     No results found for any visits on 11/27/21.    The ASCVD Risk score (Arnett DK, et al., 2019) failed to calculate for the following reasons:   The 2019 ASCVD risk score is only valid for ages 55 to 73    Assessment & Plan:   Problem List Items Addressed This Visit       Other   Elevated AST (SGOT) - Primary   Relevant Orders   76 Abdomen Complete   Libido, decreased   Elevated BP without diagnosis of hypertension   Gilbert's disease    No follow-ups on file.  Recommended no more than 2 servings of alcohol per day which would be 12 ounce beer, 8 ounces of wine or a 2 ounce serving of hard liquor.  Has follow-up scheduled in 6 months for blood pressure recheck.  [place], MD  Virtual Visit via Video Note  I connected with Mliss Sax T Voorhis on 11/27/21 at  3:40 PM EDT by a video enabled telemedicine application and verified that I am speaking with the correct person using two identifiers.  Location: Patient: home alone Provider: work   I discussed the limitations of evaluation and  management by telemedicine and the availability of in person appointments. The patient expressed understanding and agreed to proceed.  History of Present Illness:    Observations/Objective:   Assessment and Plan:   Follow Up Instructions:    I discussed the assessment and treatment plan with the patient. The patient was provided an opportunity to ask questions and all were answered. The patient agreed with the plan and demonstrated an understanding of the instructions.   The patient was advised to call back or seek an in-person evaluation if the symptoms worsen or if the condition fails to improve as anticipated.  I provided 20 minutes of non-face-to-face time during this encounter.   Libby Maw, MD

## 2021-12-03 ENCOUNTER — Encounter: Payer: Self-pay | Admitting: Physical Therapy

## 2021-12-03 ENCOUNTER — Ambulatory Visit: Payer: 59 | Attending: Family Medicine | Admitting: Physical Therapy

## 2021-12-03 DIAGNOSIS — M5459 Other low back pain: Secondary | ICD-10-CM | POA: Insufficient documentation

## 2021-12-03 DIAGNOSIS — R252 Cramp and spasm: Secondary | ICD-10-CM | POA: Diagnosis present

## 2021-12-03 DIAGNOSIS — R29898 Other symptoms and signs involving the musculoskeletal system: Secondary | ICD-10-CM | POA: Diagnosis present

## 2021-12-03 NOTE — Therapy (Signed)
[place]. [place], [place], [address] Phone: [phone]   Fax:  [phone]  Physical Therapy Treatment  Patient Details  Name: Steven Shields MRN: [id number] Date of Birth: Dec 27, 1990 Referring Provider (PT): Lynne Leader   Encounter Date: 12/03/2021   PT End of Session - 12/03/21 0927     Visit Number 4    Date for PT Re-Evaluation 12/28/21    Authorization Time Period 11/02/21 to 12/28/21    PT Start Time 0845    PT Stop Time 0930    PT Time Calculation (min) 45 min    Activity Tolerance Patient tolerated treatment well    Behavior During Therapy [facility] for tasks assessed/performed             Past Medical History:  Diagnosis Date   Acne    Pectus excavatum     History reviewed. No pertinent surgical history.  There were no vitals filed for this visit.   Subjective Assessment - 12/03/21 0846     Subjective Same LBP, back felt ok this morning at the gym..    Currently in Pain? No/denies                               [facility] Adult PT Treatment/Exercise - 12/03/21 0001       Knee/Hip Exercises: Stretches   Passive Hamstring Stretch Both;3 reps;20 seconds    Piriformis Stretch Both;3 reps;20 seconds    Other Knee/Hip Stretches Glute stretch      Knee/Hip Exercises: Aerobic   Elliptical level 4 x 5 minutes      Knee/Hip Exercises: Standing   Hip Extension Stengthening;Both;2 sets;10 reps;Knee straight    Extension Limitations 20    Forward Step Up Both;2 sets;10 reps   Mat table   Other Standing Knee Exercises AR press 35lb 2x10    Other Standing Knee Exercises Rows 25lb 2x10      Knee/Hip Exercises: Supine   Other Supine Knee/Hip Exercises Dead bugs 2x10 each      Knee/Hip Exercises: Prone   Other Prone Exercises Birddog Row 42lb x10 x8                       PT Short Term Goals - 11/20/21 0931       PT SHORT TERM GOAL #1   Title Will be  compliant with appropriate progressive HEP    Status Partially Met      PT SHORT TERM GOAL #2   Title Will have good understanding of functional biomechancs and posture    Status Partially Met               PT Long Term Goals - 12/03/21 0928       PT LONG TERM GOAL #1   Title MMT to improve by one grade in all weak groups    Status Partially Met      PT LONG TERM GOAL #2   Title Pain to be no more than 3/10 at worst    Status On-going      PT LONG TERM GOAL #3   Title Will be able to perform job duties including floor to stand and repeated lifting without increase in pain    Status On-going                   Plan - 12/03/21 4656  Clinical Impression Statement Pt continues to have LBP. Session focused on functional core strength and stretching. Some core weakness present with anti rotational presses and bird dog rows. Postural cue needed with shoulder and hip Extensions. Bilateral Hs tightness ans piriformis tightness on the R side with passive stretching.    Personal Factors and Comorbidities Time since onset of injury/illness/exacerbation    Examination-Activity Limitations Transfers;Reach Overhead;Squat;Stand;Lift    Examination-Participation Restrictions Occupation;Community Activity;Interpersonal Relationship;Volunteer;Yard Work    Stability/Clinical Decision Making Stable/Uncomplicated    Rehab Potential Good    PT Frequency 1x / week    PT Duration 8 weeks    PT Treatment/Interventions ADLs/Self Care Home Management;Cryotherapy;Electrical Stimulation;Iontophoresis 6m/ml Dexamethasone;Moist Heat;Traction;Ultrasound;Functional mobility training;Therapeutic activities;Therapeutic exercise;Balance training;Neuromuscular re-education;Patient/family education;Manual techniques;Passive range of motion;Dry needling;Taping;Spinal Manipulations    PT Next Visit Plan see how the traction did as well as the LE stretching             Patient will benefit from  skilled therapeutic intervention in order to improve the following deficits and impairments:  Decreased range of motion, Increased fascial restricitons, Increased muscle spasms, Pain, Impaired flexibility, Improper body mechanics, Decreased strength, Postural dysfunction  Visit Diagnosis: Other low back pain  Other symptoms and signs involving the musculoskeletal system  Cramp and spasm     Problem List Patient Active Problem List   Diagnosis Date Noted   Gilbert's disease 11/27/2021   Acne vulgaris 11/18/2021   Elevated AST (SGOT) 11/18/2021   Libido, decreased 11/18/2021   Elevated BP without diagnosis of hypertension 11/18/2021   Healthcare maintenance 10/21/2020   OSA (obstructive sleep apnea) 11/26/2017   Insomnia 11/26/2017   Pancreatitis 01/20/2016   RUQ abdominal pain 01/17/2016    RScot Jun PTA 12/03/2021, 9:41 AM  [place] [address]Phone: [phone]  Fax:  [phone] Name: Steven Shields MRN: [id number] of Birth: [birth year]

## 2021-12-07 ENCOUNTER — Ambulatory Visit (HOSPITAL_BASED_OUTPATIENT_CLINIC_OR_DEPARTMENT_OTHER): Payer: 59

## 2021-12-11 NOTE — Progress Notes (Deleted)
   I, Wendy Poet, LAT, ATC, am serving as scribe for Dr. Lynne Leader.  Steven Shields is a 31 y.o. male who presents to [place] at [facility] today for f/u of R LBP and glute pain acute exacerbation of chronic low back pain.  He was last seen by Dr. Georgina Snell on 10/19/21 and was referred to PT of which he's completed 4 visits.  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  Today, pt reports   Diagnostic imaging: L-spine XR- 10/19/21  Pertinent review of systems: ***  Relevant historical information: ***   Exam:  There were no vitals taken for this visit. General: Well Developed, well nourished, and in no acute distress.   MSK: ***    Lab and Radiology Results No results found for this or any previous visit (from the past 72 hour(s)). No results found.     Assessment and Plan: 31 y.o. male with ***   PDMP not reviewed this encounter. No orders of the defined types were placed in this encounter.  No orders of the defined types were placed in this encounter.    Discussed warning signs or symptoms. Please see discharge instructions. Patient expresses understanding.   ***

## 2021-12-14 ENCOUNTER — Ambulatory Visit: Payer: 59 | Admitting: Family Medicine

## 2022-04-20 ENCOUNTER — Other Ambulatory Visit (HOSPITAL_COMMUNITY): Payer: Self-pay

## 2022-04-20 ENCOUNTER — Ambulatory Visit (INDEPENDENT_AMBULATORY_CARE_PROVIDER_SITE_OTHER): Payer: 59

## 2022-04-20 ENCOUNTER — Ambulatory Visit
Admission: EM | Admit: 2022-04-20 | Discharge: 2022-04-20 | Disposition: A | Discharge status: Home / Self Care | Payer: 59 | Attending: Emergency Medicine | Admitting: Emergency Medicine

## 2022-04-20 DIAGNOSIS — R079 Chest pain, unspecified: Secondary | ICD-10-CM

## 2022-04-20 DIAGNOSIS — M940 Chondrocostal junction syndrome [Tietze]: Secondary | ICD-10-CM

## 2022-04-20 MED ORDER — NAPROXEN 500 MG PO TABS
500.0000 mg | ORAL_TABLET | Freq: Two times a day (BID) | ORAL | 0 refills | Status: AC
  Filled 2022-04-20: qty 60, 30d supply, fill #0

## 2022-04-20 NOTE — ED Provider Notes (Signed)
[facility]    CSN: [id number] Arrival date & time: 04/20/22  0820    HISTORY   Chief Complaint  Patient presents with   Rib Injury   HPI Steven Shields is a pleasant, 31 y.o. male who presents to urgent care today. Complains of pain on the right side of his rib cage that began 4 to 5 days ago.  Patient states that he attempted to crack his back on a wooden bench and his pain began shortly thereafter.  Patient states that his pain is worse with deep inhalation.  Patient states he has taken Advil with some relief of his pain.  The history is provided by the patient.   Past Medical History:  Diagnosis Date   Acne    Pectus excavatum    Patient Active Problem List   Diagnosis Date Noted   Gilbert's disease 11/27/2021   Acne vulgaris 11/18/2021   Elevated AST (SGOT) 11/18/2021   Libido, decreased 11/18/2021   Elevated BP without diagnosis of hypertension 11/18/2021   Healthcare maintenance 10/21/2020   OSA (obstructive sleep apnea) 11/26/2017   Insomnia 11/26/2017   Pancreatitis 01/20/2016   RUQ abdominal pain 01/17/2016   History reviewed. No pertinent surgical history.  Home Medications    Prior to Admission medications   Medication Sig Start Date End Date Taking? Authorizing Provider  Zinc 50 MG TABS 1 tablet 03/18/20   [provider]    Family History Family History  Problem Relation Age of Onset   Diabetes Mother        type 1   Diabetes Maternal Grandfather    Social History Social History   Tobacco Use   Smoking status: Never   Smokeless tobacco: Current    Types: Chew  Vaping Use   Vaping Use: Never used  Substance Use Topics   Alcohol use: Yes    Alcohol/week: 0.0 standard drinks of alcohol    Comment: occasionally   Drug use: No   Allergies   Patient has no known allergies.  Review of Systems Review of Systems Pertinent findings revealed after performing a 14 point review of systems has been noted in the history  of present illness.  Physical Exam Triage Vital Signs ED Triage Vitals  Enc Vitals Group     BP 05/01/21 0827 (!) 147/82     Pulse Rate 05/01/21 0827 72     Resp 05/01/21 0827 18     Temp 05/01/21 0827 98.3 F (36.8 C)     Temp Source 05/01/21 0827 Oral     SpO2 05/01/21 0827 98 %     Weight --      Height --      Head Circumference --      Peak Flow --      Pain Score 05/01/21 0826 5     Pain Loc --      Pain Edu? --      Excl. in [place]? --    Updated Vital Signs BP (!) 155/80 (BP Location: Right Arm)   Pulse (!) 56   Temp 97.6 F (36.4 C) (Oral)   Resp 16   SpO2 97%   Physical Exam Vitals and nursing note reviewed.  Constitutional:      General: He is awake. He is not in acute distress.    Appearance: Normal appearance. He is well-developed, well-groomed and normal weight. He is not ill-appearing.  HENT:     Head: Normocephalic and atraumatic.  Eyes:  Extraocular Movements: Extraocular movements intact.     Conjunctiva/sclera: Conjunctivae normal.     Pupils: Pupils are equal, round, and reactive to light.  Cardiovascular:     Rate and Rhythm: Normal rate and regular rhythm.  Pulmonary:     Effort: Pulmonary effort is normal. No tachypnea, bradypnea, accessory muscle usage, prolonged expiration, respiratory distress or retractions.     Breath sounds: Normal breath sounds and air entry. No stridor, decreased air movement or transmitted upper airway sounds. No decreased breath sounds, wheezing, rhonchi or rales.  Chest:     Chest wall: Tenderness (1) present. No mass, lacerations, deformity, swelling, crepitus or edema. There is no dullness to percussion.    Musculoskeletal:        General: Normal range of motion.     Cervical back: Normal range of motion and neck supple.  Skin:    General: Skin is warm and dry.  Neurological:     General: No focal deficit present.     Mental Status: He is alert, oriented to person, place, and time and easily aroused. Mental  status is at baseline.  Psychiatric:        Mood and Affect: Mood normal.        Behavior: Behavior normal. Behavior is cooperative.        Thought Content: Thought content normal.        Judgment: Judgment normal.     UC Couse / Diagnostics / Procedures:     Radiology DG Ribs Unilateral W/Chest Right  Result Date: 04/20/2022 CLINICAL DATA:  Right chest pain EXAM: RIGHT RIBS AND CHEST - 3+ VIEW COMPARISON:  Chest radiographs done on 04/25/2013 FINDINGS: Cardiac size is within normal limits. There are no signs of pulmonary edema or focal pulmonary consolidation. There is no pleural effusion or pneumothorax. No fracture is seen in right ribs. IMPRESSION: No fracture is seen in right ribs. No active cardiopulmonary disease. Electronically Signed   By: Elmer Picker M.D.   On: 04/20/2022 09:39    Procedures Procedures (including critical care time) EKG  Pending results:  Labs Reviewed - No data to display  Medications Ordered in UC: Medications - No data to display  UC Diagnoses / Final Clinical Impressions(s)   I have reviewed the triage vital signs and the nursing notes.  Pertinent labs & imaging results that were available during my care of the patient were reviewed by me and considered in my medical decision making (see chart for details).    Final diagnoses:  Costochondritis    Patient was provided with an injection during their visit today for acute pain relief.  Patient was advised to:  {LOWERBACKPAINPLAN:26747}  ED Prescriptions     Medication Sig Dispense Auth. Provider   naproxen (NAPROSYN) 500 MG tablet Take 1 tablet (500 mg total) by mouth 2 (two) times daily. 60 tablet Lynden Oxford Scales, PA-C      PDMP not reviewed this encounter.  Discharge Instructions:   Discharge Instructions      I have enclosed some information about costochondritis and self-care at home.  I sent a prescription for naproxen to your pharmacy.  I would like for you to  take it twice daily for at least the next week to keep your pain well controlled.  As you can imagine, if you avoid deep breathing because of pain, you can put yourself at risk for developing pneumonia.  If we keep your pain well controlled, you can continue to breathe and this will not  be an issue.  If your symptoms get worse, your pain does not improve or you begin to hear a clicking sound in your chest with deep inspiration, please go to the emergency room for further evaluation.  CT scan would be recommended which we cannot order here at urgent care.  Thank you for visiting urgent care today.      Disposition Upon Discharge:  Condition: stable for discharge home Home: take medications as prescribed; routine discharge instructions as discussed; follow up as advised.  Patient presented with an acute illness with associated systemic symptoms and significant discomfort requiring urgent management. In my opinion, this is a condition that a prudent lay person (someone who possesses an average knowledge of health and medicine) may potentially expect to result in complications if not addressed urgently such as respiratory distress, impairment of bodily function or dysfunction of bodily organs.   Routine symptom specific, illness specific and/or disease specific instructions were discussed with the patient and/or caregiver at length.   As such, the patient has been evaluated and assessed, work-up was performed and treatment was provided in alignment with urgent care protocols and evidence based medicine.  Patient/parent/caregiver has been advised that the patient may require follow up for further testing and treatment if the symptoms continue in spite of treatment, as clinically indicated and appropriate.  Patient/parent/caregiver has been advised to report to orthopedic urgent care clinic or return to the [facility] or PCP in 3-5 days if no better; follow-up with orthopedics, PCP or the Emergency  Department if new signs and symptoms develop or if the current signs or symptoms continue to change or worsen for further workup, evaluation and treatment as clinically indicated and appropriate  The patient will follow up with their current PCP if and as advised. If the patient does not currently have a PCP we will have assisted them in obtaining one.   The patient may need specialty follow up if the symptoms continue, in spite of conservative treatment and management, for further workup, evaluation, consultation and treatment as clinically indicated and appropriate.  Patient/parent/caregiver verbalized understanding and agreement of plan as discussed.  All questions were addressed during visit.  Please see discharge instructions below for further details of plan.  This office note has been dictated using Museum/gallery curator.  Unfortunately, this method of dictation can sometimes lead to typographical or grammatical errors.  I apologize for your inconvenience in advance if this occurs.  Please do not hesitate to reach out to me if clarification is needed.

## 2022-04-20 NOTE — ED Triage Notes (Signed)
The pt c/o right sided rib cage pain that is exacerbated with deep inhalation. The patient states a few days ago he tried to crack his back on a wooden bench and the pain began that night.   Started: 4-5 days ago   Home interventions: advil

## 2022-04-20 NOTE — Discharge Instructions (Addendum)
I have enclosed some information about costochondritis and self-care at home.  I sent a prescription for naproxen to your pharmacy.  I would like for you to take it twice daily for at least the next week to keep your pain well controlled.  As you can imagine, if you avoid deep breathing because of pain, you can put yourself at risk for developing pneumonia.  If we keep your pain well controlled, you can continue to breathe and this will not be an issue.  If your symptoms get worse, your pain does not improve or you begin to hear a clicking sound in your chest with deep inspiration, please go to the emergency room for further evaluation.  CT scan would be recommended which we cannot order here at urgent care.  Thank you for visiting urgent care today.

## 2022-05-01 ENCOUNTER — Other Ambulatory Visit (HOSPITAL_COMMUNITY): Payer: Self-pay

## 2022-11-18 ENCOUNTER — Ambulatory Visit: Payer: 59 | Admitting: Family Medicine

## 2022-11-18 NOTE — Progress Notes (Deleted)
   Rubin Payor, PhD, LAT, ATC acting as a scribe for Clementeen Graham, MD.  [place] T Rhinehart is a 32 y.o. male who presents to [facility] at [facility] today for exacerbation of chronic low back pain.  He was last seen by Dr. [name] on 10/19/21 and was referred to PT of which he's completed 4 visits.  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  Today, pt reports ***   Dx imaging: 10/19/21 L-spine XR   Pertinent review of systems: ***  Relevant historical information: ***   Exam:  There were no vitals taken for this visit. General: Well Developed, well nourished, and in no acute distress.   MSK: ***    Lab and Radiology Results No results found for this or any previous visit (from the past 72 hour(s)). No results found.     Assessment and Plan: 32 y.o. male with ***   PDMP not reviewed this encounter. No orders of the defined types were placed in this encounter.  No orders of the defined types were placed in this encounter.    Discussed warning signs or symptoms. Please see discharge instructions. Patient expresses understanding.   ***

## 2022-11-19 ENCOUNTER — Ambulatory Visit: Payer: 59 | Admitting: Family Medicine

## 2022-11-19 ENCOUNTER — Encounter: Payer: Self-pay | Admitting: Family Medicine

## 2022-11-19 ENCOUNTER — Other Ambulatory Visit (HOSPITAL_COMMUNITY): Payer: Self-pay

## 2022-11-19 VITALS — BP 122/78 | HR 80 | Ht 75.0 in | Wt 226.2 lb

## 2022-11-19 DIAGNOSIS — G8929 Other chronic pain: Secondary | ICD-10-CM

## 2022-11-19 DIAGNOSIS — M5441 Lumbago with sciatica, right side: Secondary | ICD-10-CM | POA: Diagnosis not present

## 2022-11-19 MED ORDER — PREDNISONE 50 MG PO TABS
ORAL_TABLET | ORAL | 0 refills | Status: DC
  Filled 2022-11-19: qty 5, 5d supply, fill #0

## 2022-11-19 MED ORDER — TIZANIDINE HCL 4 MG PO TABS
4.0000 mg | ORAL_TABLET | Freq: Four times a day (QID) | ORAL | 1 refills | Status: DC | PRN
  Filled 2022-11-19: qty 30, 8d supply, fill #0

## 2022-11-19 NOTE — Patient Instructions (Addendum)
Thank you for coming in today.   You should hear from MRI scheduling within 1 week. If you do not hear please let me know.   I've sent a prescription for Prednisone & Tizanidine to your pharmacy.   Check back after MRI

## 2022-11-19 NOTE — Progress Notes (Signed)
Rubin Payor, PhD, LAT, ATC acting as a scribe for Steven Graham, MD.  Steven Shields is a 32 y.o. male who presents to [facility] at [facility] today for exacerbation of chronic low back pain.  He was last seen by Dr. Denyse Shields on 10/19/21 and was referred to PT of which he's completed 4 visits.  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  He did physical therapy a year ago and has completed and continued physician directed and PT directed home exercise program this entire time.  He thinks the therapy and home exercise program did not help very much at all.  Today, pt reports right-sided lower back pain radiating into the right upper leg. Denies injury. Denies n/t or weakness in the LE. No treatment modalities for flare-up. Sx are constant, worsening over the past few weeks. Continues with regular activity at the work and gym.  This has not resolved despite home exercise program directed previously by PT and myself.  His pain is severe and interfere with his ability to work as a IT sales professional.   Dx imaging: 10/19/21 L-spine XR   Pertinent review of systems: No fevers or chills  Relevant historical information: Sleep apnea.   Exam:  BP 122/78   Pulse 80   Ht 6\' 3"  (1.905 m)   Wt 226 lb 3.2 oz (102.6 kg)   SpO2 99%   BMI 28.27 kg/m  General: Well Developed, well nourished, and in no acute distress.   MSK: L-spine: Normal appearing Nontender to palpation. Decreased lumbar motion. Lower extremity strength decreased right hip flexion 4/5 compared to right.  Otherwise lower extremity strength is intact. Reflexes and sensation are intact. Positive right-sided slump test.  Right hip: Normal-appearing normal hip motion.  Strength reduced hip flexion as noted above otherwise intact.    Lab and Radiology Results   EXAM: LUMBAR SPINE - COMPLETE 4+ VIEW   COMPARISON:  None.   FINDINGS: No recent fracture is seen. There is mild  retrolisthesis at L5-S1 level. There is questionable minimal retrolisthesis at L4-L5 level. Degenerative changes are noted with disc space narrowing and facet hypertrophy at L4-L5 and L5-S1 levels. Paraspinal soft tissues are unremarkable.   IMPRESSION: No recent fracture is seen in the lumbar spine.   Degenerative changes are noted with disc space narrowing and facet hypertrophy at L4-L5 and L5-S1 levels. There is mild retrolisthesis at L5-S1 level. There is questionable minimal retrolisthesis at L4-L5 level.     Electronically Signed   By: [name] M.D.   On: 10/19/2021 15:56 I, Steven Shields, personally (independently) visualized and performed the interpretation of the images attached in this note.     Assessment and Plan: 32 y.o. male with acute exacerbation of right low back pain with new right lumbar radiculopathy.  His pain has been ongoing for greater than 1 year despite trial of physical therapy and continued physician directed home exercise program.  He has new symptoms of right anterior thigh pain thought to be lumbar radiculopathy at around L3.  This is a progression.  Plan for MRI lumbar spine.  Trial of prednisone and tizanidine.  Could add gabapentin if needed as well.  Recheck after MRI.  May consider direct epidural steroid injection if patient has nerve impingement that corresponds with his current pain thought to be right L3.   PDMP not reviewed this encounter. Orders Placed This Encounter  Procedures   MR LUMBAR SPINE WO CONTRAST  Standing Status:   Future    Standing Expiration Date:   12/20/2022    Order Specific Question:   What is the patient's sedation requirement?    Answer:   No Sedation    Order Specific Question:   Does the patient have a pacemaker or implanted devices?    Answer:   No    Order Specific Question:   Preferred imaging location?    Answer:   [facility] (table limit-350lbs)   Meds ordered this encounter   Medications   predniSONE (DELTASONE) 50 MG tablet    Sig: Take 1 tablet by mouth daily for 5 days    Dispense:  5 tablet    Refill:  0   tiZANidine (ZANAFLEX) 4 MG tablet    Sig: Take 1 tablet (4 mg total) by mouth every 6 (six) hours as needed for muscle spasms.    Dispense:  30 tablet    Refill:  1     Discussed warning signs or symptoms. Please see discharge instructions. Patient expresses understanding.   The above documentation has been reviewed and is accurate and complete Steven Shields, M.D.

## 2022-11-25 IMAGING — DX DG LUMBAR SPINE COMPLETE 4+V
5 series · 5 of 5 positions shown · non-contrast
Comparison: None.

CLINICAL DATA: Chronic low back pain radiating into right gluteus,
worse the last 2 months.

EXAM:
LUMBAR SPINE - COMPLETE 4+ VIEW

[l-spine ap]
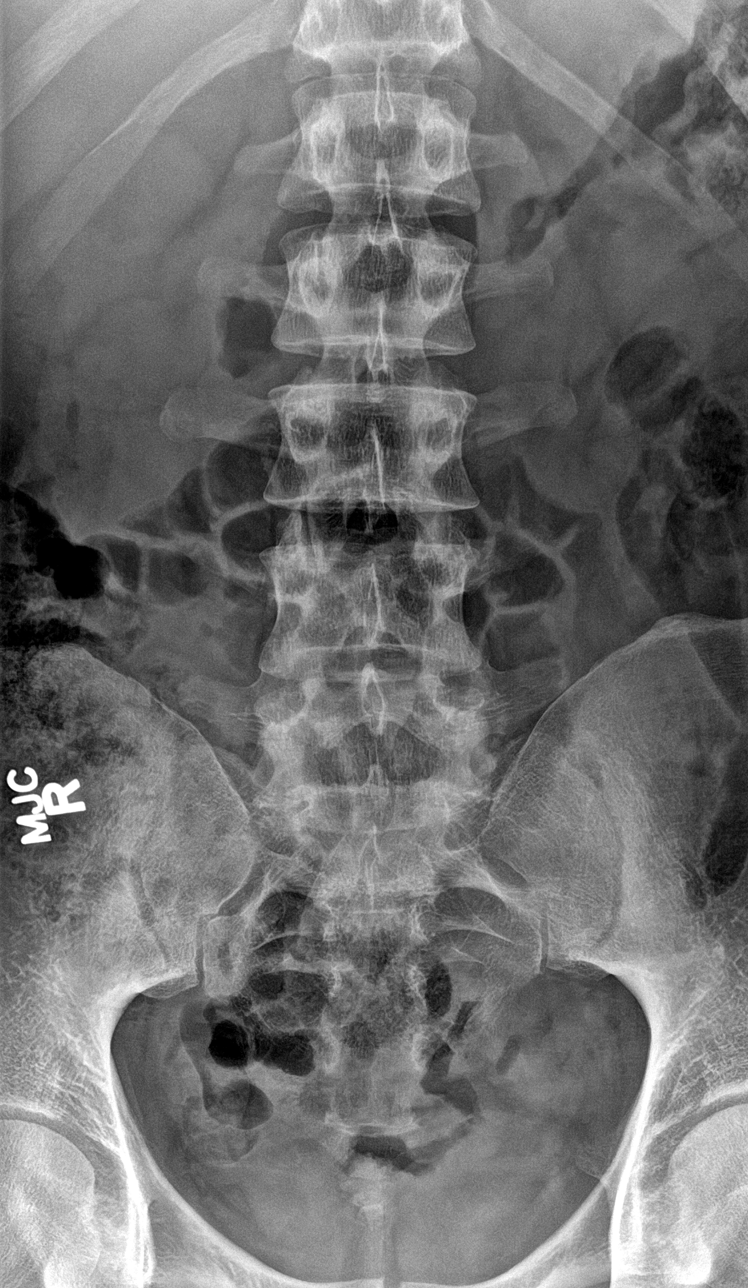

[l-spine obl (1 of 2)]
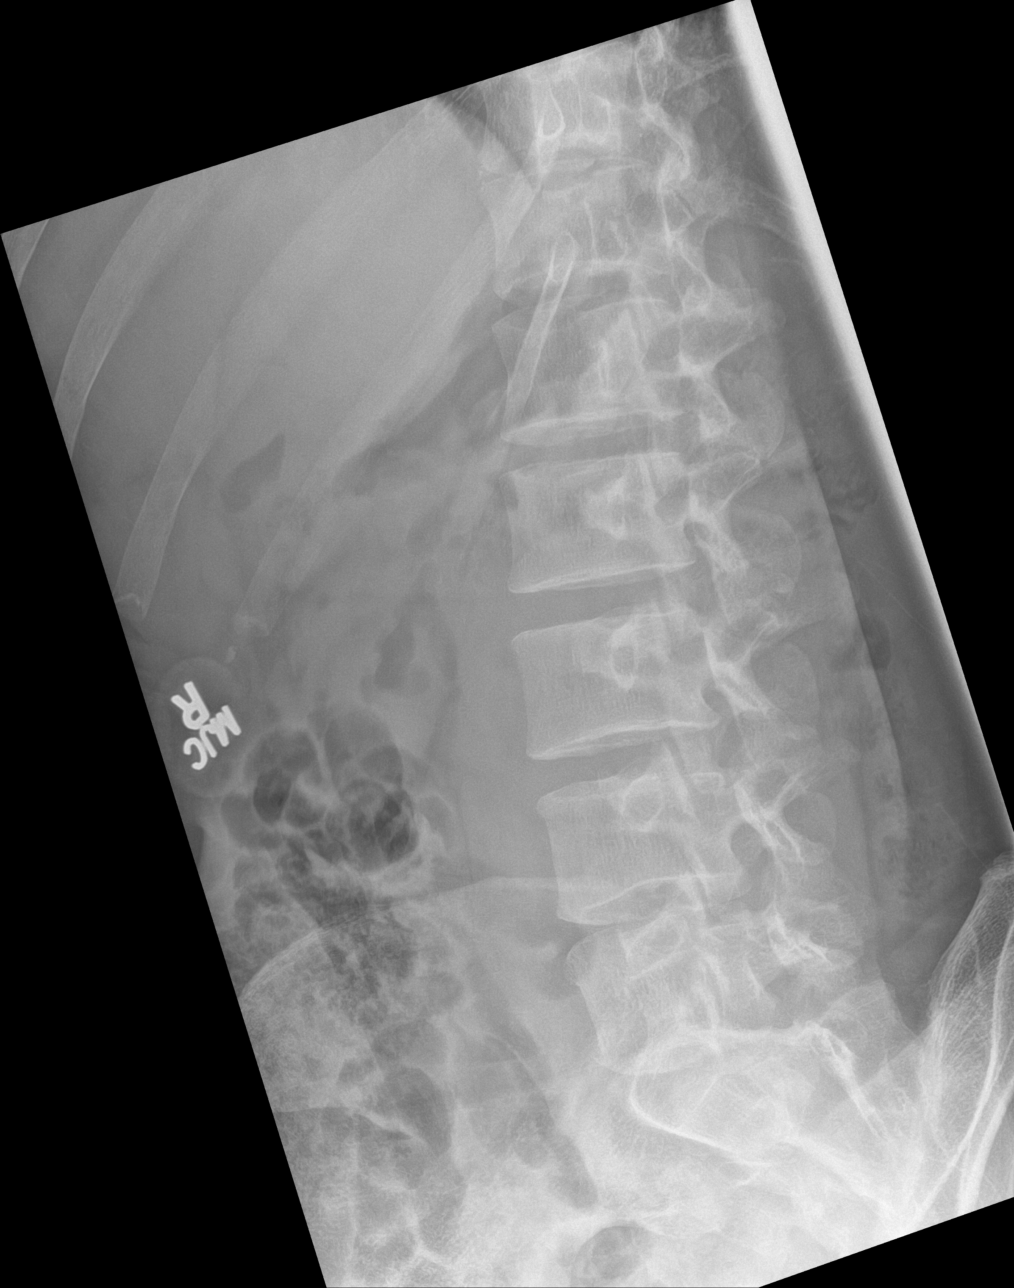

[l-spine obl (2 of 2)]
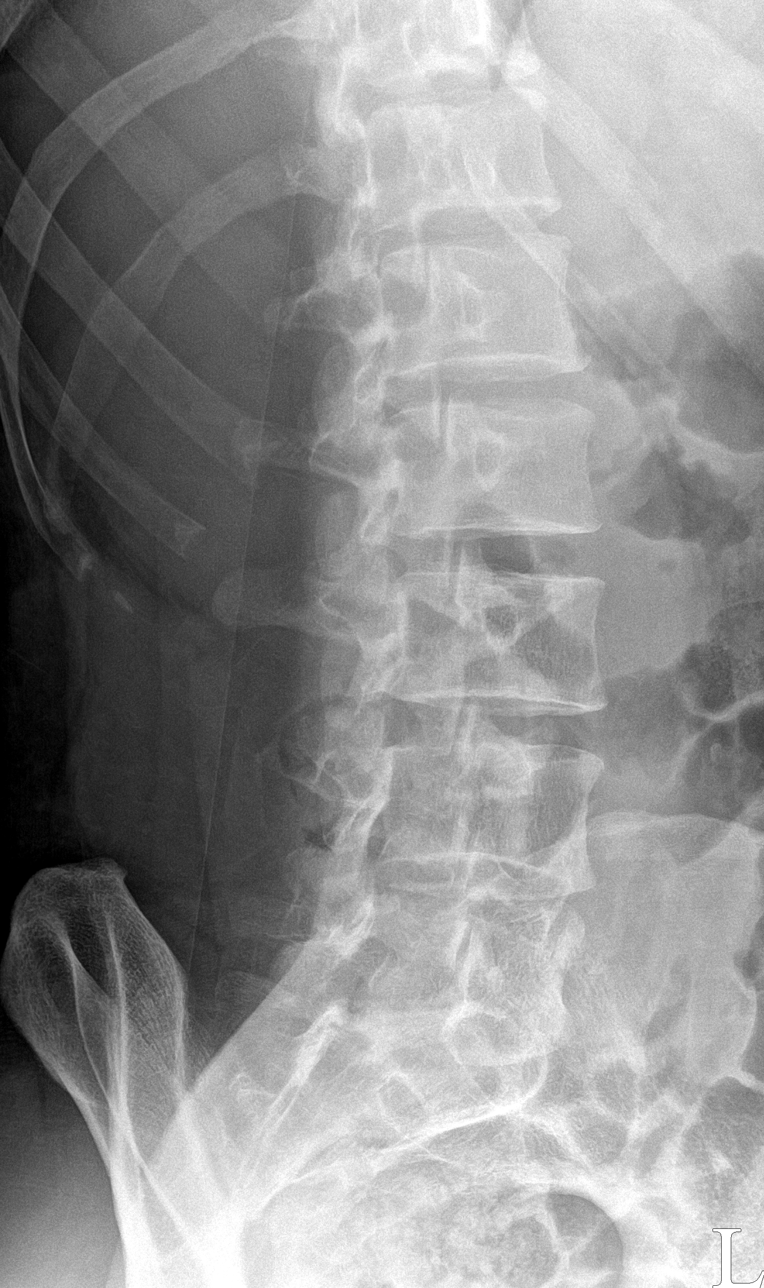

[l-spine lateral]
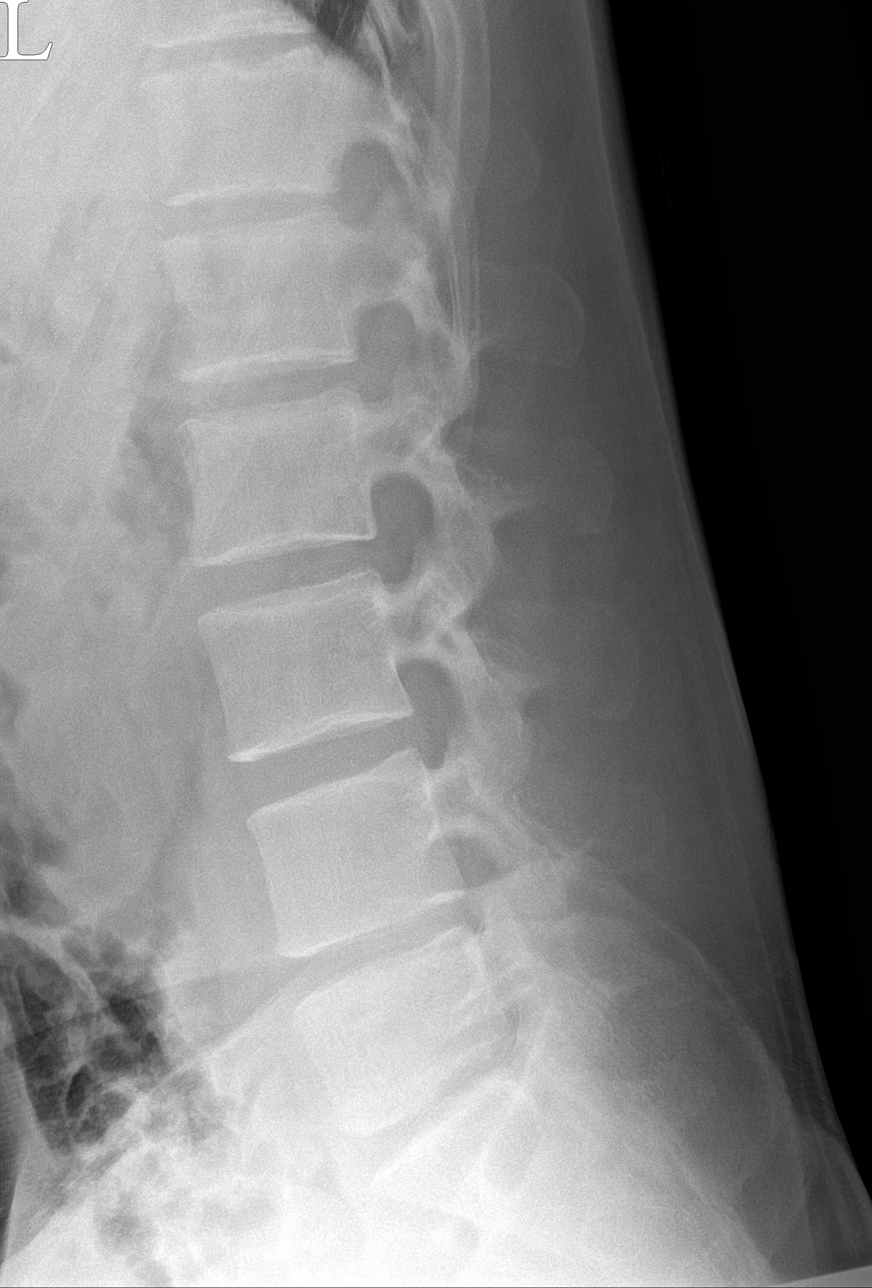

[l-spine spot]
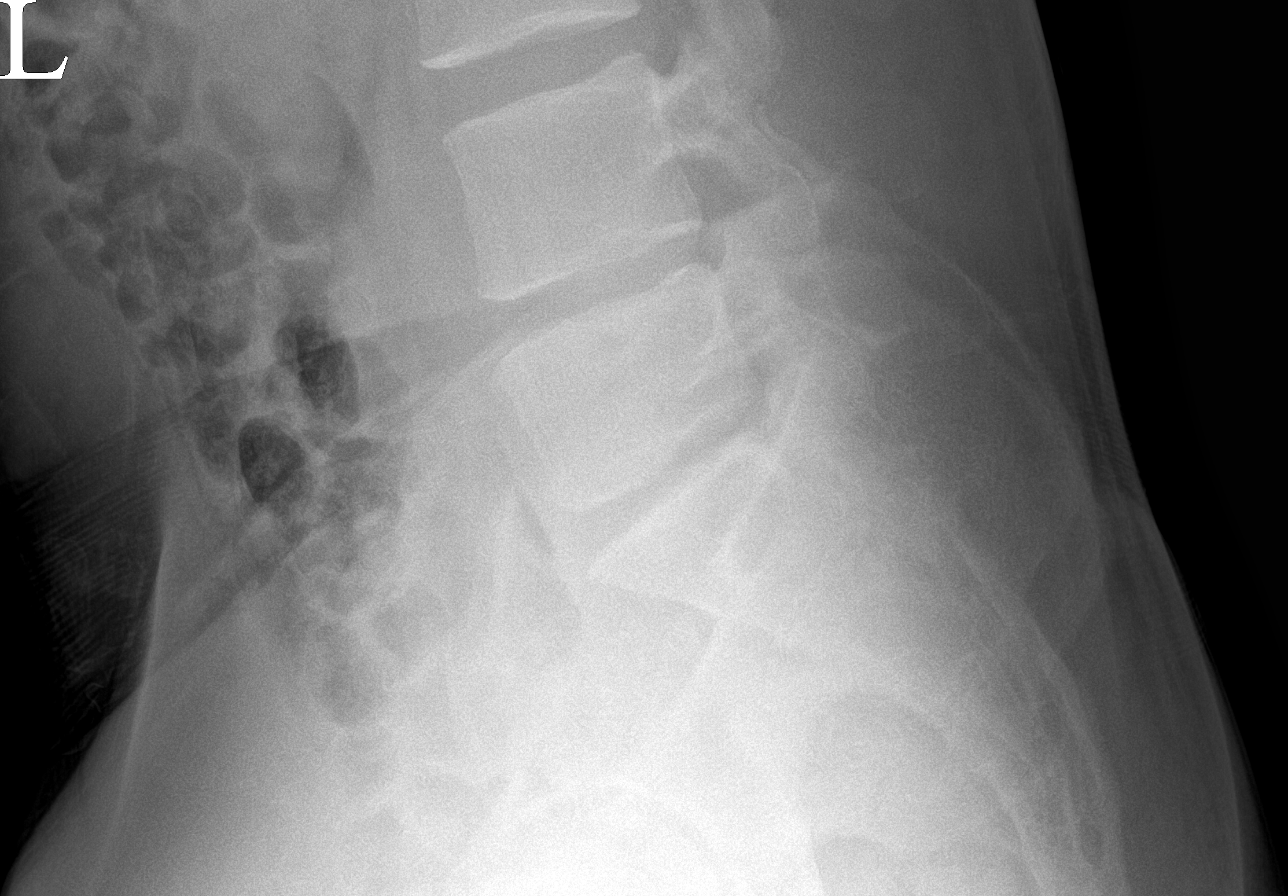

[5 of 5 positions shown; findings below may reference images not displayed]

FINDINGS: No recent fracture is seen. There is mild retrolisthesis at L5-S1
level. There is questionable minimal retrolisthesis at L4-L5 level.
Degenerative changes are noted with disc space narrowing and facet
hypertrophy at L4-L5 and L5-S1 levels. Paraspinal soft tissues are
unremarkable.
IMPRESSION: No recent fracture is seen in the lumbar spine.

Degenerative changes are noted with disc space narrowing and facet
hypertrophy at L4-L5 and L5-S1 levels. There is mild retrolisthesis
at L5-S1 level. There is questionable minimal retrolisthesis at
L4-L5 level.

## 2022-11-30 ENCOUNTER — Ambulatory Visit (INDEPENDENT_AMBULATORY_CARE_PROVIDER_SITE_OTHER): Payer: 59

## 2022-11-30 DIAGNOSIS — M5441 Lumbago with sciatica, right side: Secondary | ICD-10-CM

## 2022-11-30 DIAGNOSIS — G8929 Other chronic pain: Secondary | ICD-10-CM | POA: Diagnosis not present

## 2022-12-07 ENCOUNTER — Telehealth: Payer: Self-pay | Admitting: Family Medicine

## 2022-12-07 DIAGNOSIS — G8929 Other chronic pain: Secondary | ICD-10-CM

## 2022-12-07 NOTE — Progress Notes (Signed)
As we spoke on the phone today you have several areas in the back where the nerves could be pinched.  I have ordered an epidural steroid injection.  You should hear from radiology soon about scheduling it.

## 2022-12-07 NOTE — Telephone Encounter (Signed)
I called [place] and we talked about the MRI results and an epidural steroid injection.  You should hear soon about scheduling.

## 2022-12-16 ENCOUNTER — Ambulatory Visit
Admission: RE | Admit: 2022-12-16 | Discharge: 2022-12-16 | Disposition: A | Discharge status: Home / Self Care | Payer: 59 | Source: Ambulatory Visit | Attending: Family Medicine | Admitting: Family Medicine

## 2022-12-16 DIAGNOSIS — G8929 Other chronic pain: Secondary | ICD-10-CM

## 2022-12-16 MED ORDER — METHYLPREDNISOLONE ACETATE 40 MG/ML INJ SUSP (RADIOLOG
80.0000 mg | Freq: Once | INTRAMUSCULAR | Status: AC
  Administered 2022-12-16: 80 mg via EPIDURAL

## 2022-12-16 MED ORDER — IOPAMIDOL (ISOVUE-M 200) INJECTION 41%
1.0000 mL | Freq: Once | INTRAMUSCULAR | Status: AC
  Administered 2022-12-16: 1 mL via EPIDURAL

## 2022-12-16 NOTE — Discharge Instructions (Signed)

## 2023-01-04 ENCOUNTER — Encounter: Payer: Self-pay | Admitting: Family Medicine

## 2023-01-04 DIAGNOSIS — G8929 Other chronic pain: Secondary | ICD-10-CM

## 2023-01-14 ENCOUNTER — Ambulatory Visit
Admission: RE | Admit: 2023-01-14 | Discharge: 2023-01-14 | Disposition: A | Discharge status: Home / Self Care | Payer: 59 | Source: Ambulatory Visit | Attending: Family Medicine | Admitting: Family Medicine

## 2023-01-14 DIAGNOSIS — G8929 Other chronic pain: Secondary | ICD-10-CM

## 2023-01-14 MED ORDER — METHYLPREDNISOLONE ACETATE 40 MG/ML INJ SUSP (RADIOLOG
80.0000 mg | Freq: Once | INTRAMUSCULAR | Status: AC
  Administered 2023-01-14: 80 mg via EPIDURAL

## 2023-01-14 MED ORDER — IOPAMIDOL (ISOVUE-M 200) INJECTION 41%
1.0000 mL | Freq: Once | INTRAMUSCULAR | Status: AC
  Administered 2023-01-14: 1 mL via EPIDURAL

## 2023-01-14 NOTE — Discharge Instructions (Signed)

## 2023-06-20 ENCOUNTER — Other Ambulatory Visit (HOSPITAL_COMMUNITY): Payer: Self-pay

## 2023-06-20 MED ORDER — PREDNISONE 10 MG (48) PO TBPK
ORAL_TABLET | ORAL | 0 refills | Status: DC
  Filled 2023-06-20: qty 48, 12d supply, fill #0

## 2023-08-02 ENCOUNTER — Telehealth: Payer: 59 | Admitting: Physician Assistant

## 2023-08-02 DIAGNOSIS — J312 Chronic pharyngitis: Secondary | ICD-10-CM

## 2023-08-02 NOTE — Progress Notes (Signed)
  Because of how long the sore throat has been present and need for examination and swabbing to confirm a diagnosis and rule out rare but more serious causes of symptoms, I feel your condition warrants further evaluation and I recommend that you be seen in a face-to-face visit.   NOTE: There will be NO CHARGE for this E-Visit   If you are having a true medical emergency, please call 911.     For an urgent face to face visit, [facility] has multiple urgent care centers for your convenience.  Click the link below for the full list of locations and hours, walk-in wait times, appointment scheduling options and driving directions:  Urgent Care - [place], [place], [place], [place], [place], [place]  [place]     Your MyChart E-visit questionnaire answers were reviewed by a board certified advanced clinical practitioner to complete your personal care plan based on your specific symptoms.    Thank you for using e-Visits.

## 2023-08-03 ENCOUNTER — Ambulatory Visit
Admission: EM | Admit: 2023-08-03 | Discharge: 2023-08-03 | Disposition: A | Discharge status: Home / Self Care | Payer: 59 | Attending: Family Medicine | Admitting: Family Medicine

## 2023-08-03 ENCOUNTER — Other Ambulatory Visit: Payer: Self-pay

## 2023-08-03 DIAGNOSIS — J029 Acute pharyngitis, unspecified: Secondary | ICD-10-CM

## 2023-08-03 LAB — POCT RAPID STREP A (OFFICE): Rapid Strep A Screen: NEGATIVE

## 2023-08-03 LAB — POCT MONO SCREEN (KUC): Mono, POC: NEGATIVE

## 2023-08-03 NOTE — ED Triage Notes (Signed)
Pt triaged by provider. Pt presents with complaints of sore throat x 1 month. Pt currently rates his throat pain a 6/10. Denies taking OTC medications for symptoms reported.

## 2023-08-03 NOTE — ED Provider Notes (Signed)
[facility]    CSN: [id number] Arrival date & time: 08/03/23  1154      History   Chief Complaint No chief complaint on file.   HPI Steven Shields is a 33 y.o. male.   HPI Sore throat for 1 month, worse at night. Had some sinus congestion and postnasal drip at onset of illness, was seen by ENT diagnosed with nasal polyps with 2 weeks of prednisone, states on follow-up visit polyps had resolved. Denies current rhinorrhea, nasal congestion, fever, chills, sweats, swollen glands, redness or white patches in the throat, cough,, known contacts with illness.  He is a IT sales professional.  Past Medical History:  Diagnosis Date   Acne    Pectus excavatum     Patient Active Problem List   Diagnosis Date Noted   Gilbert's disease 11/27/2021   Acne vulgaris 11/18/2021   Elevated AST (SGOT) 11/18/2021   Libido, decreased 11/18/2021   Elevated BP without diagnosis of hypertension 11/18/2021   Healthcare maintenance 10/21/2020   OSA (obstructive sleep apnea) 11/26/2017   Insomnia 11/26/2017   Pancreatitis 01/20/2016   RUQ abdominal pain 01/17/2016    No past surgical history on file.     Home Medications    Prior to Admission medications   Not on File    Family History Family History  Problem Relation Age of Onset   Diabetes Mother        type 1   Diabetes Maternal Grandfather     Social History Social History   Tobacco Use   Smoking status: Never   Smokeless tobacco: Current    Types: Chew  Vaping Use   Vaping status: Never Used  Substance Use Topics   Alcohol use: Yes    Alcohol/week: 0.0 standard drinks of alcohol    Comment: occasionally   Drug use: No     Allergies   Patient has no known allergies.   Review of Systems Review of Systems  Constitutional:  Negative for appetite change, chills, fatigue and fever.  HENT:  Positive for sore throat. Negative for dental problem, rhinorrhea and trouble swallowing.   Respiratory:  Negative  for cough.   Gastrointestinal:  Negative for abdominal pain, nausea and vomiting.  Skin:  Negative for rash.  Neurological:  Negative for headaches.     Physical Exam Triage Vital Signs ED Triage Vitals [08/03/23 1201]  Encounter Vitals Group     BP 135/84     Systolic BP Percentile      Diastolic BP Percentile      Pulse Rate 72     Resp 16     Temp 97.9 F (36.6 C)     Temp Source Oral     SpO2 96 %     Weight      Height      Head Circumference      Peak Flow      Pain Score      Pain Loc      Pain Education      Exclude from Growth Chart    No data found.  Updated Vital Signs BP 135/84 (BP Location: Right Arm)   Pulse 72   Temp 97.9 F (36.6 C) (Oral)   Resp 16   SpO2 96%   Visual Acuity Right Eye Distance:   Left Eye Distance:   Bilateral Distance:    Right Eye Near:   Left Eye Near:    Bilateral Near:     Physical Exam  UC Treatments / Results  Labs (all labs ordered are listed, but only abnormal results are displayed) Labs Reviewed - No data to display  EKG   Radiology No results found.  Procedures Procedures (including critical care time)  Medications Ordered in UC Medications - No data to display  Initial Impression / Assessment and Plan / UC Course  I have reviewed the triage vital signs and the nursing notes.  Pertinent labs & imaging results that were available during my care of the patient were reviewed by me and considered in my medical decision making (see chart for details).     33 year old male with sore throat for 1 month without fever.  Was recently treated for nasal polyps.  Denies history of seasonal allergies.  Denies well-appearing posterior pharynx is clear no erythema or exudate no tonsillar enlargement uvula is midline normal swallowing, clear voice.  Point-of-care strep is negative, point-of-care mono is negative.  Recommend patient try antihistamine, but water gargles, if symptoms persist follow-up with ENT Final  Clinical Impressions(s) / UC Diagnoses   Final diagnoses:  None   Discharge Instructions   None    ED Prescriptions   None    PDMP not reviewed this encounter.   Meliton Rattan, [place] 08/03/23 1226

## 2023-08-03 NOTE — Discharge Instructions (Signed)
Your strep and monotest were negative.  Try an antihistamine daily, salt water gargles for symptomatic relief.  If your sore throat fails to improve follow-up with your ENT doctor

## 2024-06-21 NOTE — Progress Notes (Unsigned)
° °   Ben Jackson D.[facility] [address] [place] [address] Phone: [phone]   Assessment and Plan:     ***    Pertinent previous records reviewed include ***   Follow Up: ***     Subjective:   I, Aashritha Miedema, am serving as a neurosurgeon for Doctor Morene Mace  Chief Complaint: low back pain   HPI:   06/22/2024 Patient is a 33 year old male with low back pain. Patient states   Relevant Historical Information: ***  Additional pertinent review of systems negative.  Current Medications[1]   Objective:     There were no vitals filed for this visit.    There is no height or weight on file to calculate BMI.    Physical Exam:    ***   Electronically signed by:  Odis Mace D.[facility] 7:26 AM 06/21/2024    [1] No current outpatient medications on file.

## 2024-06-22 ENCOUNTER — Ambulatory Visit: Admitting: Sports Medicine

## 2024-06-22 ENCOUNTER — Other Ambulatory Visit (HOSPITAL_COMMUNITY): Payer: Self-pay

## 2024-06-22 VITALS — BP 118/82 | Ht 75.0 in | Wt 226.0 lb

## 2024-06-22 DIAGNOSIS — M9905 Segmental and somatic dysfunction of pelvic region: Secondary | ICD-10-CM

## 2024-06-22 DIAGNOSIS — G8929 Other chronic pain: Secondary | ICD-10-CM

## 2024-06-22 DIAGNOSIS — M9903 Segmental and somatic dysfunction of lumbar region: Secondary | ICD-10-CM | POA: Diagnosis not present

## 2024-06-22 DIAGNOSIS — M9902 Segmental and somatic dysfunction of thoracic region: Secondary | ICD-10-CM | POA: Diagnosis not present

## 2024-06-22 DIAGNOSIS — M5441 Lumbago with sciatica, right side: Secondary | ICD-10-CM | POA: Diagnosis not present

## 2024-06-22 DIAGNOSIS — M5126 Other intervertebral disc displacement, lumbar region: Secondary | ICD-10-CM | POA: Diagnosis not present

## 2024-06-22 MED ORDER — MELOXICAM 15 MG PO TABS
ORAL_TABLET | ORAL | 0 refills | Status: AC
  Filled 2024-06-22: qty 30, 30d supply, fill #0

## 2024-06-22 NOTE — Patient Instructions (Signed)
 Low back HEP   PT referral   - Start meloxicam 15 mg daily x2 weeks.  If still having pain after 2 weeks, complete 3rd-week of NSAID. May use remaining NSAID as needed once daily for pain control.  Do not to use additional over-the-counter NSAIDs (ibuprofen, naproxen , Advil, Aleve , etc.) while taking prescription NSAIDs.  May use Tylenol [id number] mg 2 to 3 times a day for breakthrough pain.  Neurosurgery referral   4-5 week follow up

## 2024-07-04 ENCOUNTER — Ambulatory Visit: Admitting: Physical Therapy

## 2024-07-04 ENCOUNTER — Other Ambulatory Visit: Payer: Self-pay

## 2024-07-04 ENCOUNTER — Encounter: Payer: Self-pay | Admitting: Physical Therapy

## 2024-07-04 DIAGNOSIS — M5459 Other low back pain: Secondary | ICD-10-CM

## 2024-07-04 DIAGNOSIS — M6281 Muscle weakness (generalized): Secondary | ICD-10-CM

## 2024-07-04 NOTE — Patient Instructions (Signed)
 Access Code: [email or web address] URL: [email or web address] Date: 07/04/2024 Prepared by: Elaine Daring  Exercises - Prone Hip Extension with Bent Knee  - 3-4 x weekly - 3 sets - 5 reps - 3 seconds hold - Supine 90/90 Alternating Heel Touches with Posterior Pelvic Tilt  - 3-4 x weekly - 3 sets - 5 reps - Single Leg Bridge  - 3-4 x weekly - 3 sets - 5 reps - 3 seconds hold - Side Plank with Clam  - 3-4 x weekly - 10 reps - 5 seconds hold

## 2024-07-04 NOTE — Therapy (Signed)
 " OUTPATIENT PHYSICAL THERAPY EVALUATION   Patient Name: Steven Shields MRN: [id number] DOB:December 23, 1990, 33 y.o., male Today's Date: 07/04/2024   END OF SESSION:  PT End of Session - 07/04/24 1013     Visit Number 1    Number of Visits 9    Date for Recertification  08/29/24    Authorization Type UHC    PT Start Time 0802    PT Stop Time 0858    PT Time Calculation (min) 56 min    Activity Tolerance Patient tolerated treatment well    Behavior During Therapy [facility] for tasks assessed/performed          Past Medical History:  Diagnosis Date   Acne    Pectus excavatum    History reviewed. No pertinent surgical history. Patient Active Problem List   Diagnosis Date Noted   Gilbert's disease 11/27/2021   Acne vulgaris 11/18/2021   Elevated AST (SGOT) 11/18/2021   Libido, decreased 11/18/2021   Elevated BP without diagnosis of hypertension 11/18/2021   Healthcare maintenance 10/21/2020   OSA (obstructive sleep apnea) 11/26/2017   Insomnia 11/26/2017   Pancreatitis 01/20/2016   RUQ abdominal pain 01/17/2016    PCP: Berneta Elsie Sayre, MD   REFERRING PROVIDER: Leonce Katz, DO  REFERRING DIAG: Chronic right-sided low back pain with right-sided sciatica  Rationale for Evaluation and Treatment: Rehabilitation  THERAPY DIAG:  Other low back pain  Muscle weakness (generalized)  ONSET DATE: Chronic   SUBJECTIVE SUBJECTIVE STATEMENT: Patient reports lower back pain that's be going on for years. He has had a couple injections in the past. Reports constant low back pain, there's days where he can't bend down and has to sit to put socks/shoes on. 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 He states he used to go to the gym every day but hasn't been in months because of pain. He reports pain right lower back, denies any pain into the lower back. Bending down seems to flare up the pain the most, having to pick up  objects or people at work, or squatting in the gym. He also reports pain is worse in the morning and feels very stiff.   PERTINENT HISTORY:  See PMH above  PAIN:  Are you having pain? Yes:  NPRS scale: 0/10 currently at rest, 8-9/10 at worst Pain location: Right lower back Pain description: Stabbing, stiff Aggravating factors: Bending, lifting, squatting, putting on socks Relieving factors: Medication  PRECAUTIONS: None  RED FLAGS: None   WEIGHT BEARING RESTRICTIONS: No  FALLS:  Has patient fallen in last 6 months? No  OCCUPATION: Fireman  PLOF: Independent  PATIENT GOALS: Pain relief, getting back to lifting and working out in the gym   OBJECTIVE:  Note: Objective measures were completed at Evaluation unless otherwise noted. PATIENT SURVEYS:  PSFS: 3.4 Back squats: 0 Putting gear on at work: 4 Putting on socks: 5 Getting off of floor: 5 Picking up weight at gym: 3  COGNITION: Overall cognitive status: Within functional limits for tasks assessed     SENSATION: WFL  MUSCLE LENGTH: R > L hamstring and hip flexor tightness  POSTURE:   Grossly WFL  PALPATION: Tender to palpation right lumbar paraspinals  Lumbar CPA reproduces concordant right lower back pain at L4 region  LUMBAR ROM:   AROM eval  Flexion WFL  Extension WFL  Right lateral flexion WFL  Left lateral flexion WFL  Right rotation [facility]  Left rotation  WFL   (Blank rows = not tested)  LOWER EXTREMITY ROM:      Bilateral hip PROM and figure-4 position grossly WFL  LOWER EXTREMITY MMT:    MMT Right eval Left eval  Hip flexion 4 4  Hip extension 3+ 4  Hip abduction 4- 4  Hip adduction    Hip internal rotation    Hip external rotation    Knee flexion 5 5  Knee extension 5 5  Ankle dorsiflexion    Ankle plantarflexion    Ankle inversion    Ankle eversion     (Blank rows = not tested)  FUNCTIONAL TESTS:  DLLT: 45 deg loss of lumbar control  Prone lumbar instability test:  patient reports no pain with legs elevated compared to lying supine  Squat: patient demonstrates good depth and control, reports right sided low back pain  GAIT: Assistive device utilized: None Level of assistance: Complete Independence Comments: Grossly WFL   TREATMENT  [facility] Adult PT Treatment:                                                DATE: 07/04/2024 Prone bent knee hip extension with focus on core stabilization and glute engagement 90-90 alternating heel tap SL bridge with opposite knee to chest Modified side plank with clamshell  Discussed exercise modifications with return to gym exercises, consisting of weight and range of motion limits  PATIENT EDUCATION:  Education details: Exam findings, POC, HEP Person educated: Patient Education method: Explanation, Demonstration, Tactile cues, Verbal cues, and Handouts Education comprehension: verbalized understanding, returned demonstration, verbal cues required, tactile cues required, and needs further education  HOME EXERCISE PROGRAM: Access Code: [email or web address]    ASSESSMENT: CLINICAL IMPRESSION: Patient is a 33 y.o. male who was seen today for physical therapy evaluation and treatment for chronic right sided low back pain. He does not exhibit any radicular symptoms. He exhibits good lumbar motion but does report more pain with flexion based movements. He also exhibits poor core strength and stability, and decreased strength of the R>L glute musculature. He has flexion intolerance with daily tasks such as dressing and lifting movements, and the chronicity and past experiences are likely contributing to pain experience and functional limitations.  OBJECTIVE IMPAIRMENTS: decreased activity tolerance, decreased ROM, decreased strength, impaired flexibility, and pain.   ACTIVITY LIMITATIONS: carrying, lifting, bending, squatting, and dressing  PARTICIPATION LIMITATIONS: community activity and occupation  PERSONAL FACTORS:  Past/current experiences and Time since onset of injury/illness/exacerbation are also affecting patient's functional outcome.   REHAB POTENTIAL: Good  CLINICAL DECISION MAKING: Stable/uncomplicated  EVALUATION COMPLEXITY: Low   GOALS: Goals reviewed with patient? Yes  SHORT TERM GOALS: Target date: 08/01/2024  Patient will be I with initial HEP in order to progress with therapy. Baseline: HEP provided at eval Goal status: INITIAL  2.  Patient will report right lower back pain </= 5/10 at worst with activity in order to reduce functional limitations Baseline: 8-9/10 pain Goal status: INITIAL  LONG TERM GOALS: Target date: 08/29/2024  Patient will be I with final HEP to maintain progress from PT. Baseline: HEP provided at eval Goal status: INITIAL  2.  Patient will report PSFS >/= 8 in order to indicate improvement in their functional ability. Baseline: 3.4 Goal status: INITIAL  3.  Patient will demonstrate DLLT </= 30 deg in order to indicate improved lumbopelvic  control and reduce pain with lifting tasks Baseline: 45 deg Goal status: INITIAL  4.  Patient will demonstrate hip strength >/= 4+/5 MMT in order to improve tolerance for performing work related tasks Baseline: see limitations above Goal status: INITIAL   PLAN: PT FREQUENCY: 1x/week  PT DURATION: 8 weeks  PLANNED INTERVENTIONS: 97164- PT Re-evaluation, 97750- Physical Performance Testing, 97110-Therapeutic exercises, 97530- Therapeutic activity, 97112- Neuromuscular re-education, 97535- Self Care, 02859- Manual therapy, 20560 (1-2 muscles), 20561 (3+ muscles)- Dry Needling, Patient/Family education, Joint mobilization, Joint manipulation, Spinal manipulation, Spinal mobilization, Cryotherapy, and Moist heat.  PLAN FOR NEXT SESSION: Review HEP and progress PRN, manual/TPDN for right lumbar, focus on core stability and hip strengthening, initiate lifting mechanics and gym exercise modifications   Elaine Daring,  PT, DPT, LAT, ATC 07/04/2024  11:46 AM Phone: [phone] Fax: [phone]   "

## 2024-07-11 ENCOUNTER — Other Ambulatory Visit: Payer: Self-pay

## 2024-07-11 ENCOUNTER — Encounter: Payer: Self-pay | Admitting: Physical Therapy

## 2024-07-11 ENCOUNTER — Ambulatory Visit: Admitting: Physical Therapy

## 2024-07-11 DIAGNOSIS — M5459 Other low back pain: Secondary | ICD-10-CM | POA: Diagnosis not present

## 2024-07-11 DIAGNOSIS — M6281 Muscle weakness (generalized): Secondary | ICD-10-CM | POA: Diagnosis not present

## 2024-07-11 NOTE — Patient Instructions (Signed)
 Access Code: [email or web address] URL: [email or web address] Date: 07/11/2024 Prepared by: Elaine Daring  Exercises - Prone Hip Extension with Bent Knee  - 3-4 x weekly - 3 sets - 5 reps - 3 seconds hold - Single Leg Bridge  - 3-4 x weekly - 3 sets - 8 reps - 3 seconds hold - Side Plank with Clam  - 3-4 x weekly - 2 sets - 10 reps - 5 seconds hold - Standing Anti-Rotation Press with Anchored Resistance  - 3-4 x weekly - 3 sets - 10 reps - Primal Push Up  - 3-4 x weekly - 3 sets - 5 reps - 10 seconds hold

## 2024-07-11 NOTE — Therapy (Signed)
 " OUTPATIENT PHYSICAL THERAPY TREATMENT   Patient Name: Steven Shields MRN: [id number] DOB:[birth year], 34 y.o., male Today's Date: 07/11/2024   END OF SESSION:  PT End of Session - 07/11/24 1107     Visit Number 2    Number of Visits 9    Date for Recertification  08/29/24    Authorization Type UHC    PT Start Time 1103    PT Stop Time 1145    PT Time Calculation (min) 42 min    Activity Tolerance Patient tolerated treatment well    Behavior During Therapy WFL for tasks assessed/performed           Past Medical History:  Diagnosis Date   Acne    Pectus excavatum    History reviewed. No pertinent surgical history. Patient Active Problem List   Diagnosis Date Noted   Gilbert's disease 11/27/2021   Acne vulgaris 11/18/2021   Elevated AST (SGOT) 11/18/2021   Libido, decreased 11/18/2021   Elevated BP without diagnosis of hypertension 11/18/2021   Healthcare maintenance 10/21/2020   OSA (obstructive sleep apnea) 11/26/2017   Insomnia 11/26/2017   Pancreatitis 01/20/2016   RUQ abdominal pain 01/17/2016    PCP: Berneta Elsie Sayre, MD   REFERRING PROVIDER: Leonce Katz, DO  REFERRING DIAG: Chronic right-sided low back pain with right-sided sciatica  Rationale for Evaluation and Treatment: Rehabilitation  THERAPY DIAG:  Other low back pain  Muscle weakness (generalized)  ONSET DATE: Chronic   SUBJECTIVE SUBJECTIVE STATEMENT: Patient reports he thinks the medication is still helping.  Eval: Patient reports lower back pain that's be going on for years. He has had a couple injections in the past. Reports constant low back pain, there's days where he can't bend down and has to sit to put socks/shoes on. 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 He states he used to go to the gym every day but hasn't been in months because of pain. He reports pain right lower back, denies any pain into the lower back.  Bending down seems to flare up the pain the most, having to pick up objects or people at work, or squatting in the gym. He also reports pain is worse in the morning and feels very stiff.   PERTINENT HISTORY:  See PMH above  PAIN:  Are you having pain? Yes:  NPRS scale: 3/10 currently at rest, 8-9/10 at worst Pain location: Right lower back Pain description: Stabbing, stiff Aggravating factors: Bending, lifting, squatting, putting on socks Relieving factors: Medication  PRECAUTIONS: None  PATIENT GOALS: Pain relief, getting back to lifting and working out in the gym   OBJECTIVE:  Note: Objective measures were completed at Evaluation unless otherwise noted. PATIENT SURVEYS:  PSFS: 3.4 Back squats: 0 Putting gear on at work: 4 Putting on socks: 5 Getting off of floor: 5 Picking up weight at gym: 3  MUSCLE LENGTH: R > L hamstring and hip flexor tightness  POSTURE:   Grossly WFL  PALPATION: Tender to palpation right lumbar paraspinals  Lumbar CPA reproduces concordant right lower back pain at L4 region  LUMBAR ROM:   AROM eval  Flexion WFL  Extension WFL  Right lateral flexion WFL  Left lateral flexion WFL  Right rotation WFL  Left rotation WFL   (Blank rows = not tested)  LOWER EXTREMITY ROM:      Bilateral hip PROM and figure-4 position grossly [facility]  LOWER EXTREMITY MMT:  MMT Right eval Left eval  Hip flexion 4 4  Hip extension 3+ 4  Hip abduction 4- 4  Hip adduction    Hip internal rotation    Hip external rotation    Knee flexion 5 5  Knee extension 5 5  Ankle dorsiflexion    Ankle plantarflexion    Ankle inversion    Ankle eversion     (Blank rows = not tested)  FUNCTIONAL TESTS:  DLLT: 45 deg loss of lumbar control  Prone lumbar instability test: patient reports no pain with legs elevated compared to lying supine  Squat: patient demonstrates good depth and control, reports right sided low back pain  GAIT: Assistive device utilized:  None Level of assistance: Complete Independence Comments: Grossly WFL   TREATMENT  [facility] Adult PT Treatment:                                                DATE: 07/11/2024 1/2 kneeling hip flexor stretch 3 x 20 sec SL bridge with opposite knee to chest 2 x 8 x 3 sec each 90-90 alternating heel tap - d/c'd due to patient reporting pain Modified side plank with clamshell 10 x 5 sec each Standing hip hinge with dowel x 10 Standing hip hinge without dowel x 10 Standing hip hinge / hip extension with L4 powerband pulling posteriorly at hips to improve gluteal engagement x 10 Deadlift from 8 box with 20# x 10, 30# x 10 Pallof press with L2 powerband x 10 each Bird dog x 10 Bear crawl hold 5 x 10 sec  PATIENT EDUCATION:  Education details: HEP update Person educated: Patient Education method: Explanation, Demonstration, Tactile cues, Verbal cues, and Handouts Education comprehension: verbalized understanding, returned demonstration, verbal cues required, tactile cues required, and needs further education  HOME EXERCISE PROGRAM: Access Code: [email or web address]    ASSESSMENT: CLINICAL IMPRESSION: Patient tolerated therapy well with no adverse effects. Therapy focused on progress core stabilization and hip strengthening, and incorporated lifting mechanics and progress this visit. He does continue to report constant baseline pain around 3/10 and did report pain increased to 6/10 following therapy, but states this is normal for him with activity. He was able to progress to lifting technique using hip hinge technique and deadlifting with light weight from higher surface without reported increase in pain. He did report increased pain with 90-90 alternating foot so d/c'd that exercises and incorporated more quadruped core stabilization with better tolerance. Updated his HEP to progress core stabilization for home. Patient would benefit from continued skilled PT to progress mobility and strength in order to  reduce pain and maximize functional ability.   Eval: Patient is a 34 y.o. male who was seen today for physical therapy evaluation and treatment for chronic right sided low back pain. He does not exhibit any radicular symptoms. He exhibits good lumbar motion but does report more pain with flexion based movements. He also exhibits poor core strength and stability, and decreased strength of the R>L glute musculature. He has flexion intolerance with daily tasks such as dressing and lifting movements, and the chronicity and past experiences are likely contributing to pain experience and functional limitations.  OBJECTIVE IMPAIRMENTS: decreased activity tolerance, decreased ROM, decreased strength, impaired flexibility, and pain.   ACTIVITY LIMITATIONS: carrying, lifting, bending, squatting, and dressing  PARTICIPATION LIMITATIONS: community activity and occupation  PERSONAL FACTORS:  Past/current experiences and Time since onset of injury/illness/exacerbation are also affecting patient's functional outcome.    GOALS: Goals reviewed with patient? Yes  SHORT TERM GOALS: Target date: 08/01/2024  Patient will be I with initial HEP in order to progress with therapy. Baseline: HEP provided at eval Goal status: INITIAL  2.  Patient will report right lower back pain </= 5/10 at worst with activity in order to reduce functional limitations Baseline: 8-9/10 pain Goal status: INITIAL  LONG TERM GOALS: Target date: 08/29/2024  Patient will be I with final HEP to maintain progress from PT. Baseline: HEP provided at eval Goal status: INITIAL  2.  Patient will report PSFS >/= 8 in order to indicate improvement in their functional ability. Baseline: 3.4 Goal status: INITIAL  3.  Patient will demonstrate DLLT </= 30 deg in order to indicate improved lumbopelvic control and reduce pain with lifting tasks Baseline: 45 deg Goal status: INITIAL  4.  Patient will demonstrate hip strength >/= 4+/5 MMT in  order to improve tolerance for performing work related tasks Baseline: see limitations above Goal status: INITIAL   PLAN: PT FREQUENCY: 1x/week  PT DURATION: 8 weeks  PLANNED INTERVENTIONS: 97164- PT Re-evaluation, 97750- Physical Performance Testing, 97110-Therapeutic exercises, 97530- Therapeutic activity, 97112- Neuromuscular re-education, 97535- Self Care, 02859- Manual therapy, 20560 (1-2 muscles), 20561 (3+ muscles)- Dry Needling, Patient/Family education, Joint mobilization, Joint manipulation, Spinal manipulation, Spinal mobilization, Cryotherapy, and Moist heat.  PLAN FOR NEXT SESSION: Review HEP and progress PRN, manual/TPDN for right lumbar, focus on core stability and hip strengthening, initiate lifting mechanics and gym exercise modifications   Elaine Daring, PT, DPT, LAT, ATC 07/11/2024  12:18 PM Phone: [phone] Fax: [phone]   "

## 2024-07-12 NOTE — Progress Notes (Unsigned)
"             ° °   Steven Shields D.[facility] [address] [place] [address] Phone: [phone]   Assessment and Plan:     ***    Pertinent previous records reviewed include ***   Follow Up: ***     Subjective:   I, Steven Shields, am serving as a neurosurgeon for Doctor Morene Mace   Chief Complaint: low back pain    HPI:    11/19/2022 Steven Shields is a 34 y.o. male who presents to [facility] today for exacerbation of chronic low back pain.  He was last seen by Dr. Joane on 10/19/21 and was referred to PT of which he's completed 4 visits.  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  He did physical therapy a year ago and has completed and continued physician directed and PT directed home exercise program this entire time.  He thinks the therapy and home exercise program did not help very much at all.   Today, pt reports right-sided lower back pain radiating into the right upper leg. Denies injury. Denies n/t or weakness in the LE. No treatment modalities for flare-up. Sx are constant, worsening over the past few weeks. Continues with regular activity at the work and gym.  This has not resolved despite home exercise program directed previously by PT and myself.  His pain is severe and interfere with his ability to work as a it sales professional.   Dx imaging: 10/19/21 L-spine XR   06/22/2024 Patient is a 34 year old male with low back pain. Patient states epidurals didn't last long and the pain is still present and getting worse   07/13/2024 Patient states    Relevant Historical Information: None pertinent  Additional pertinent review of systems negative.  Current Medications[1]   Objective:     There were no vitals filed for this visit.    There is no height or weight on file to calculate BMI.    Physical Exam:    ***   Electronically signed by:  Odis Mace D.[facility] 7:16 AM 07/12/2024    [1]  Current Outpatient Medications:    meloxicam  (MOBIC ) 15 MG tablet, Take 1 tablet daily for 2 weeks.  If still in pain after 2 weeks, take 1 tablet daily for an additional 1 week., Disp: 30 tablet, Rfl: 0  "

## 2024-07-13 ENCOUNTER — Ambulatory Visit: Admitting: Sports Medicine

## 2024-07-16 ENCOUNTER — Other Ambulatory Visit: Payer: Self-pay

## 2024-07-16 ENCOUNTER — Encounter: Payer: Self-pay | Admitting: Physical Therapy

## 2024-07-16 ENCOUNTER — Ambulatory Visit: Admitting: Physical Therapy

## 2024-07-16 DIAGNOSIS — M6281 Muscle weakness (generalized): Secondary | ICD-10-CM | POA: Diagnosis not present

## 2024-07-16 DIAGNOSIS — M5459 Other low back pain: Secondary | ICD-10-CM | POA: Diagnosis not present

## 2024-07-16 NOTE — Therapy (Signed)
 " OUTPATIENT PHYSICAL THERAPY TREATMENT   Patient Name: Steven Shields MRN: [id number] DOB:[birth year], 34 y.o., male Today's Date: 07/16/2024   END OF SESSION:  PT End of Session - 07/16/24 0845     Visit Number 3    Number of Visits 9    Date for Recertification  08/29/24    Authorization Type [employer]    PT Start Time 0845    PT Stop Time 0930    PT Time Calculation (min) 45 min    Activity Tolerance Patient tolerated treatment well    Behavior During Therapy [facility] for tasks assessed/performed            Past Medical History:  Diagnosis Date   Acne    Pectus excavatum    History reviewed. No pertinent surgical history. Patient Active Problem List   Diagnosis Date Noted   Gilbert's disease 11/27/2021   Acne vulgaris 11/18/2021   Elevated AST (SGOT) 11/18/2021   Libido, decreased 11/18/2021   Elevated BP without diagnosis of hypertension 11/18/2021   Healthcare maintenance 10/21/2020   OSA (obstructive sleep apnea) 11/26/2017   Insomnia 11/26/2017   Pancreatitis 01/20/2016   RUQ abdominal pain 01/17/2016    PCP: Berneta Elsie Sayre, MD   REFERRING PROVIDER: Leonce Katz, DO  REFERRING DIAG: Chronic right-sided low back pain with right-sided sciatica  Rationale for Evaluation and Treatment: Rehabilitation  THERAPY DIAG:  Other low back pain  Muscle weakness (generalized)  ONSET DATE: Chronic   SUBJECTIVE SUBJECTIVE STATEMENT: Patient reports his back feels good this morning. He did feel some pain shoot down his right butt region for a few hours last Friday.   Eval: Patient reports lower back pain that's be going on for years. He has had a couple injections in the past. Reports constant low back pain, there's days where he can't bend down and has to sit to put socks/shoes on. 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 He states he used to go to the gym every day but hasn't been in months because of  pain. He reports pain right lower back, denies any pain into the lower back. Bending down seems to flare up the pain the most, having to pick up objects or people at work, or squatting in the gym. He also reports pain is worse in the morning and feels very stiff.   PERTINENT HISTORY:  See PMH above  PAIN:  Are you having pain? Yes:  NPRS scale: 3/10 currently at rest, 8-9/10 at worst Pain location: Right lower back Pain description: Stabbing, stiff Aggravating factors: Bending, lifting, squatting, putting on socks Relieving factors: Medication  PRECAUTIONS: None  PATIENT GOALS: Pain relief, getting back to lifting and working out in the gym   OBJECTIVE:  Note: Objective measures were completed at Evaluation unless otherwise noted. PATIENT SURVEYS:  PSFS: 3.4 Back squats: 0 Putting gear on at work: 4 Putting on socks: 5 Getting off of floor: 5 Picking up weight at gym: 3  MUSCLE LENGTH: R > L hamstring and hip flexor tightness  POSTURE:   Grossly WFL  PALPATION: Tender to palpation right lumbar paraspinals  Lumbar CPA reproduces concordant right lower back pain at L4 region  LUMBAR ROM:   AROM eval  Flexion WFL  Extension WFL  Right lateral flexion WFL  Left lateral flexion WFL  Right rotation WFL  Left rotation WFL   (Blank rows = not tested)  LOWER EXTREMITY ROM:  Bilateral hip PROM and figure-4 position grossly [facility]  LOWER EXTREMITY MMT:    MMT Right eval Left eval  Hip flexion 4 4  Hip extension 3+ 4  Hip abduction 4- 4  Hip adduction    Hip internal rotation    Hip external rotation    Knee flexion 5 5  Knee extension 5 5  Ankle dorsiflexion    Ankle plantarflexion    Ankle inversion    Ankle eversion     (Blank rows = not tested)  FUNCTIONAL TESTS:  DLLT: 45 deg loss of lumbar control  Prone lumbar instability test: patient reports no pain with legs elevated compared to lying supine  Squat: patient demonstrates good depth and  control, reports right sided low back pain  GAIT: Assistive device utilized: None Level of assistance: Complete Independence Comments: Grossly WFL   TREATMENT  [facility] Adult PT Treatment:                                                DATE: 07/16/2024 Pallof press L3 powerband x 10 each Pallof press with overhead raise L2 powerband x 10 each Bear crawl hold 5 x 10 sec Bear crawl with alternating arm lift 2 x 10 Bear crawl with alternating leg lift 2 x 10 Prone trunk supported lumbar/hip extension on table 3 x 10 Lumbar/hip extension over stability ball x 10 Deadlift from 8 box with 30# x 8, 50# 3 x 8 Bulgarian split squat with 30# KB rack contralateral side 2 x 8 SL RDL with 30# 2 x 8  PATIENT EDUCATION:  Education details: HEP Person educated: Patient Education method: Programmer, Multimedia, Demonstration, Actor cues, Verbal cues Education comprehension: verbalized understanding, returned demonstration, verbal cues required, tactile cues required, and needs further education  HOME EXERCISE PROGRAM: Access Code: [email or web address]    ASSESSMENT: CLINICAL IMPRESSION: Patient tolerated therapy well with no adverse effects. Therapy continues to focus on progressing core stabilization and strengthening with good tolerance. Progressed with his weighted lifting this visit and incorporating more unilateral exercises to improve core control. Also included lumbar/hip extension with good tolerance and patient did not report increased pain in therapy this visit. No specific changes to his HEP but discussed using back extension machine at gym and gradual progression of lifting using trap bar. Patient would benefit from continued skilled PT to progress mobility and strength in order to reduce pain and maximize functional ability.   Eval: Patient is a 34 y.o. male who was seen today for physical therapy evaluation and treatment for chronic right sided low back pain. He does not exhibit any radicular symptoms. He  exhibits good lumbar motion but does report more pain with flexion based movements. He also exhibits poor core strength and stability, and decreased strength of the R>L glute musculature. He has flexion intolerance with daily tasks such as dressing and lifting movements, and the chronicity and past experiences are likely contributing to pain experience and functional limitations.  OBJECTIVE IMPAIRMENTS: decreased activity tolerance, decreased ROM, decreased strength, impaired flexibility, and pain.   ACTIVITY LIMITATIONS: carrying, lifting, bending, squatting, and dressing  PARTICIPATION LIMITATIONS: community activity and occupation  PERSONAL FACTORS: Past/current experiences and Time since onset of injury/illness/exacerbation are also affecting patient's functional outcome.    GOALS: Goals reviewed with patient? Yes  SHORT TERM GOALS: Target date: 08/01/2024  Patient will be I with initial  HEP in order to progress with therapy. Baseline: HEP provided at eval Goal status: INITIAL  2.  Patient will report right lower back pain </= 5/10 at worst with activity in order to reduce functional limitations Baseline: 8-9/10 pain Goal status: INITIAL  LONG TERM GOALS: Target date: 08/29/2024  Patient will be I with final HEP to maintain progress from PT. Baseline: HEP provided at eval Goal status: INITIAL  2.  Patient will report PSFS >/= 8 in order to indicate improvement in their functional ability. Baseline: 3.4 Goal status: INITIAL  3.  Patient will demonstrate DLLT </= 30 deg in order to indicate improved lumbopelvic control and reduce pain with lifting tasks Baseline: 45 deg Goal status: INITIAL  4.  Patient will demonstrate hip strength >/= 4+/5 MMT in order to improve tolerance for performing work related tasks Baseline: see limitations above Goal status: INITIAL   PLAN: PT FREQUENCY: 1x/week  PT DURATION: 8 weeks  PLANNED INTERVENTIONS: 97164- PT Re-evaluation, 97750-  Physical Performance Testing, 97110-Therapeutic exercises, 97530- Therapeutic activity, 97112- Neuromuscular re-education, 97535- Self Care, 02859- Manual therapy, 20560 (1-2 muscles), 20561 (3+ muscles)- Dry Needling, Patient/Family education, Joint mobilization, Joint manipulation, Spinal manipulation, Spinal mobilization, Cryotherapy, and Moist heat.  PLAN FOR NEXT SESSION: Review HEP and progress PRN, manual/TPDN for right lumbar, focus on core stability and hip strengthening, initiate lifting mechanics and gym exercise modifications   Elaine Daring, PT, DPT, LAT, ATC 07/16/2024  9:54 AM Phone: [phone] Fax: [phone]   "

## 2024-08-09 NOTE — Progress Notes (Unsigned)
 "  Referring Physician:  Leonce Katz, DO [address] [place],  [place] [address]  Primary Physician:  Steven Elsie Sayre, MD  History of Present Illness: 08/09/2024*** [name] has a history of ***  Right lower back pain Right leg pain  Duration: *** Location: *** Quality: *** Severity: ***  Precipitating: aggravated by *** Modifying factors: made better by *** Weakness: none Timing: ***  Tobacco use: smokes *** PPD x years. Does not smoke.   Bowel/Bladder Dysfunction: none  Conservative measures:  Physical therapy: *** has participated in with [facility] x 3 visits-07/04/24-07/16/24 Multimodal medical therapy including regular antiinflammatories: *** Meloxicam  Injections: ***  01/14/2023- ESI Right L5-S1 12/16/2022- ESI Right L5-S1  Past Surgery: ***no spine surgery in the past  Steven Shields has ***no symptoms of cervical myelopathy.  The symptoms are causing a significant impact on the patient's life.   Review of Systems:  A 10 point review of systems is negative, except for the pertinent positives and negatives detailed in the HPI.  Past Medical History: Past Medical History:  Diagnosis Date   Acne    Pectus excavatum     Past Surgical History: No past surgical history on file.  Allergies: Allergies as of 08/15/2024   (No Known Allergies)    Medications: Outpatient Encounter Medications as of 08/15/2024  Medication Sig   tretinoin (RETIN-A) 0.1 % cream 1 application in the evening to face Externally Pea size amount to whole face; Duration: 30 days   Zinc 50 MG TABS 1 tablet Orally twice a day; Duration: 30 day(s)   meloxicam  (MOBIC ) 15 MG tablet Take 1 tablet daily for 2 weeks.  If still in pain after 2 weeks, take 1 tablet daily for an additional 1 week.   No facility-administered encounter medications on file as of 08/15/2024.    Social History: Social History[1]  Family Medical History: Family History  Problem  Relation Age of Onset   Diabetes Mother        type 1   Diabetes Maternal Grandfather     Physical Examination: There were no vitals filed for this visit.  General: Patient is well developed, well nourished, calm, collected, and in no apparent distress. Attention to examination is appropriate.  Respiratory: Patient is breathing without any difficulty.   NEUROLOGICAL:     Awake, alert, oriented to person, place, and time.  Speech is clear and fluent. Fund of knowledge is appropriate.   Cranial Nerves: Pupils equal round and reactive to light.  Facial tone is symmetric.    *** ROM of cervical spine *** pain *** posterior cervical tenderness. *** tenderness in bilateral trapezial region.   *** ROM of lumbar spine *** pain *** posterior lumbar tenderness.   No abnormal lesions on exposed skin.   Strength: Side Biceps Triceps Deltoid Interossei Grip Wrist Ext. Wrist Flex.  R 5 5 5 5 5 5 5   L 5 5 5 5 5 5 5    Side Iliopsoas Quads Hamstring PF DF EHL  R 5 5 5 5 5 5   L 5 5 5 5 5 5    Reflexes are ***2+ and symmetric at the biceps, brachioradialis, patella and achilles.   Hoffman's is absent.  Clonus is not present.   Bilateral upper and lower extremity sensation is intact to light touch.     Gait is normal.   ***No difficulty with tandem gait.    Medical Decision Making  Imaging: ***  I have personally reviewed the images and agree  with the above interpretation.  Assessment and Plan: [name] is a pleasant 34 y.o. male has ***  Treatment options discussed with patient and following plan made:   - Order for physical therapy for *** spine ***. Patient to call to schedule appointment. *** - Continue current medications including ***. Reviewed dosing and side effects.  - Prescription for ***. Reviewed dosing and side effects. Take with food.  - Prescription for *** to take prn muscle spasms. Reviewed dosing and side effects. Discussed this can cause drowsiness.  -  MRI of *** to further evaluate *** radiculopathy. No improvement time or medications (***).  - Referral to PMR at [facility] to discuss possible *** injections.  - Will schedule phone visit to review MRI results once I get them back.   I spent a total of *** minutes in face-to-face and non-face-to-face activities related to this patient's care today including review of outside records, review of imaging, review of symptoms, physical exam, discussion of differential diagnosis, discussion of treatment options, and documentation.   Thank you for involving me in the care of this patient.   Steven Boys PA-C Dept. of Neurosurgery     [1]  Social History Tobacco Use   Smoking status: Never   Smokeless tobacco: Current    Types: Chew  Vaping Use   Vaping status: Never Used  Substance Use Topics   Alcohol use: Yes    Alcohol/week: 0.0 standard drinks of alcohol    Comment: occasionally   Drug use: No   "

## 2024-08-15 ENCOUNTER — Ambulatory Visit: Admitting: Orthopedic Surgery
